# Patient Record
Sex: Female | Born: 1970
Health system: Southern US, Community
[De-identification: ages and names within clinical notes are randomized; demographics above are authoritative.]

## PROBLEM LIST (undated history)

## (undated) DIAGNOSIS — D649 Anemia, unspecified: Secondary | ICD-10-CM

## (undated) DIAGNOSIS — D219 Benign neoplasm of connective and other soft tissue, unspecified: Secondary | ICD-10-CM

---

## 1995-12-22 DIAGNOSIS — D219 Benign neoplasm of connective and other soft tissue, unspecified: Secondary | ICD-10-CM

## 1995-12-22 HISTORY — DX: Benign neoplasm of connective and other soft tissue, unspecified: D21.9

## 2000-12-21 HISTORY — PX: DILATION AND CURETTAGE OF UTERUS: SHX78

## 2006-07-15 ENCOUNTER — Other Ambulatory Visit: Admission: RE | Admit: 2006-07-15 | Discharge: 2006-07-15 | Payer: Self-pay | Admitting: Family Medicine

## 2006-07-16 ENCOUNTER — Ambulatory Visit (HOSPITAL_COMMUNITY): Admission: RE | Admit: 2006-07-16 | Discharge: 2006-07-16 | Payer: Self-pay | Admitting: Family Medicine

## 2007-11-21 ENCOUNTER — Ambulatory Visit (HOSPITAL_COMMUNITY): Admission: RE | Admit: 2007-11-21 | Discharge: 2007-11-21 | Payer: Self-pay | Admitting: Family Medicine

## 2011-09-29 ENCOUNTER — Inpatient Hospital Stay (HOSPITAL_COMMUNITY)
Admission: AD | Admit: 2011-09-29 | Discharge: 2011-09-29 | Disposition: A | Discharge status: Home / Self Care | Payer: Self-pay | Source: Ambulatory Visit | Attending: Family Medicine | Admitting: Family Medicine

## 2011-09-29 ENCOUNTER — Encounter (HOSPITAL_COMMUNITY): Payer: Self-pay | Admitting: *Deleted

## 2011-09-29 ENCOUNTER — Inpatient Hospital Stay (HOSPITAL_COMMUNITY): Payer: Self-pay

## 2011-09-29 DIAGNOSIS — D259 Leiomyoma of uterus, unspecified: Secondary | ICD-10-CM | POA: Insufficient documentation

## 2011-09-29 DIAGNOSIS — D649 Anemia, unspecified: Secondary | ICD-10-CM | POA: Insufficient documentation

## 2011-09-29 DIAGNOSIS — N920 Excessive and frequent menstruation with regular cycle: Secondary | ICD-10-CM | POA: Insufficient documentation

## 2011-09-29 DIAGNOSIS — D219 Benign neoplasm of connective and other soft tissue, unspecified: Secondary | ICD-10-CM

## 2011-09-29 HISTORY — DX: Benign neoplasm of connective and other soft tissue, unspecified: D21.9

## 2011-09-29 HISTORY — DX: Anemia, unspecified: D64.9

## 2011-09-29 LAB — CBC
HCT: 23 % — ABNORMAL LOW (ref 36.0–46.0)
Hemoglobin: 7 g/dL — ABNORMAL LOW (ref 12.0–15.0)
MCH: 24.1 pg — ABNORMAL LOW (ref 26.0–34.0)
MCHC: 30.4 g/dL (ref 30.0–36.0)
MCV: 79 fL (ref 78.0–100.0)
Platelets: 349 10*3/uL (ref 150–400)
RBC: 2.91 MIL/uL — ABNORMAL LOW (ref 3.87–5.11)
RDW: 14.3 % (ref 11.5–15.5)
WBC: 10.7 10*3/uL — ABNORMAL HIGH (ref 4.0–10.5)

## 2011-09-29 LAB — ABO/RH: ABO/RH(D): O POS

## 2011-09-29 LAB — POCT PREGNANCY, URINE: Preg Test, Ur: NEGATIVE

## 2011-09-29 MED ORDER — TRANEXAMIC ACID 650 MG PO TABS: 1300.0000 mg | ORAL_TABLET | Freq: Three times a day (TID) | ORAL | Status: DC

## 2011-09-29 MED ORDER — FERROUS SULFATE 325 (65 FE) MG PO TABS: 325.0000 mg | ORAL_TABLET | Freq: Three times a day (TID) | ORAL | Status: DC

## 2011-09-29 NOTE — ED Provider Notes (Addendum)
History   The patient is a 40 y.o. year old G0P0000 female who presents to MAU reporting worsening of menorrhagia. She was seen at [facility] this morning and instructed to come to MAU for further eval. She has a 15 year Hx of Menorrhagia and was Dx w/ Fibroids 5 years ago. [facility] and was Rx Provera x 10 days. Bleeding stopped and resumed 09/22/11 and has been very heavy since then. She reports dizziness and tingling lips, but denies syncope or near-syncope. She has an appointment it Gyn clinic 10/04/11. She is TTC. Hgb:   July 2012 9.23 July 2011 ~8   09/29/11 7.0    CSN: [id number] Arrival date & time: 09/29/2011  1:27 PM  Chief Complaint  Patient presents with  . Menometrorrhagia    (Consider location/radiation/quality/duration/timing/severity/associated sxs/prior treatment) HPI  Past Medical History  Diagnosis Date  . Anemia   . Fibroid 1997    Past Surgical History  Procedure Date  . Dilation and curettage of uterus 2002    abn bleeding    Family History  Problem Relation Age of Onset  . Diabetes Mother   . Hypertension Father     History  Substance Use Topics  . Smoking status: Current Some Day Smoker  . Smokeless tobacco: Not on file  . Alcohol Use: Yes    OB History    Grav Para Term Preterm Abortions TAB SAB Ect Mult Living   0 0 0 0 0 0 0 0 0 0       Review of Systems: Otherwise neg  Allergies  Review of patient's allergies indicates no known allergies.  Home Medications  No current outpatient prescriptions on file.  BP 121/54  Pulse 121  Temp(Src) 98.6 F (37 C) (Oral)  Resp 20  SpO2 100%  LMP 09/22/2011 Patient Vitals for the past 24 hrs:  BP Temp Temp src Pulse Resp SpO2  09/29/11 1635 121/54 mmHg - - 121  - -  09/29/11 1629 112/66 mmHg - - 91  - -  09/29/11 1627 107/63 mmHg - - 88  - -  09/29/11 1622 129/49 mmHg - - 77  - -  09/29/11 1346 - - - - - 100 %  09/29/11 1344  118/45 mmHg 98.6 F (37 C) Oral 111  20  100 %   Physical Exam  Nursing note and vitals reviewed. Constitutional: She appears well-developed and well-nourished. No distress.  Eyes: Conjunctivae are normal.  Cardiovascular: Normal rate.   Pulmonary/Chest: Effort normal.  Abdominal: Soft. Bowel sounds are normal. There is no tenderness.  Genitourinary: Uterus is enlarged. Uterus is not tender. Cervix exhibits no motion tenderness, no discharge and no friability. There is bleeding (mod BRB) around the vagina.    ED Course  Procedures (including critical care time) POCT PREGNANCY, URINE: neg  ABO/RH: O pos   Results for orders placed during the hospital encounter of 09/29/11 (from the past 24 hour(s))  CBC     Status: Abnormal   Collection Time   09/29/11  5:53 PM      Component Value Range   WBC 10.7 (*) 4.0 - 10.5 (K/uL)   RBC 2.91 (*) 3.87 - 5.11 (MIL/uL)   Hemoglobin 7.0 (*) 12.0 - 15.0 (g/dL)   HCT 65.7 (*) 84.6 - 46.0 (%)   MCV 79.0  78.0 - 100.0 (fL)   MCH 24.1 (*) 26.0 - 34.0 (pg)   MCHC 30.4  30.0 - 36.0 (  g/dL)   RDW 16.1  09.6 - 04.5 (%)   Platelets 349  150 - 400 (K/uL)   US Transvaginal Non-ob  09/29/2011  *RADIOLOGY REPORT*  Clinical Data: Menorrhagia  TRANSABDOMINAL AND TRANSVAGINAL ULTRASOUND OF PELVIS Technique:  Both transabdominal and transvaginal ultrasound examinations of the pelvis were performed. Transabdominal technique was performed for global imaging of the pelvis including uterus, ovaries, adnexal regions, and pelvic cul-de-sac.  Comparison: 11/21/2007   It was necessary to proceed with endovaginal exam following the transabdominal exam to visualize the uterus more accurately.  Findings:  Uterus: The uterus measures 10.1 x 5.2 x 5.8 cm.  There are two small subserosal leiomyomas at the vertex on the order of 1-1.2 cm in size.  Endometrium: 9.3 mm thick.  No endometrial fluid.  Right ovary:  Normal at 3.9 x 3.2 x 1.9 cm.  Normal follicular cysts.  Normal blood  flow.  Left ovary: Normal at 4.3 x 1.8 x 1.5 cm.  Normal follicular cysts. Normal blood flow.  Other findings: Trace amount of free fluid, not likely significant.  IMPRESSION: The examination is normal except for two small subserosal myomas near the vertex on the order of 1-1.2 cm in size.  Original Report Authenticated By: Thomasenia Sales, M.D.   US Pelvis Complete  09/29/2011  *RADIOLOGY REPORT*  Clinical Data: Menorrhagia  TRANSABDOMINAL AND TRANSVAGINAL ULTRASOUND OF PELVIS Technique:  Both transabdominal and transvaginal ultrasound examinations of the pelvis were performed. Transabdominal technique was performed for global imaging of the pelvis including uterus, ovaries, adnexal regions, and pelvic cul-de-sac.  Comparison: 11/21/2007   It was necessary to proceed with endovaginal exam following the transabdominal exam to visualize the uterus more accurately.  Findings:  Uterus: The uterus measures 10.1 x 5.2 x 5.8 cm.  There are two small subserosal leiomyomas at the vertex on the order of 1-1.2 cm in size.  Endometrium: 9.3 mm thick.  No endometrial fluid.  Right ovary:  Normal at 3.9 x 3.2 x 1.9 cm.  Normal follicular cysts.  Normal blood flow.  Left ovary: Normal at 4.3 x 1.8 x 1.5 cm.  Normal follicular cysts. Normal blood flow.  Other findings: Trace amount of free fluid, not likely significant.  IMPRESSION: The examination is normal except for two small subserosal myomas near the vertex on the order of 1-1.2 cm in size.  Original Report Authenticated By: Thomasenia Sales, M.D.     MDM  Assessment: 1. Menorrhagia 2. Anemic, symptomatic, but stable 3. TTC 4. Fibroids  Plan: 1. Offered transfusion and/or progesterone therapy per consult w/ Dr, Macon Large. Discussed R/B/I. Pt declines, prefers to avoid needles, blood products or hormones.  2. D/C home 3. F/U in Gyn clinic 11/04/11 4. Rx Lysteda 1300 PO TID x days 1-5 of period   Dorathy Kinsman 09/29/2011 9:13 PM  Attestation of Attending  Supervision of Advanced Practitioner: Evaluation and management procedures were performed by the PA/NP/CNM/OB Fellow under my supervision/collaboration. Chart reviewed and agree with management and plan.  Shakim Faith A 09/30/2011 12:28 AM

## 2011-09-29 NOTE — Progress Notes (Addendum)
Abnormal bleeding since age 40 and since July heavier, [facility] and  Has [facility] clinic NOV , took cycle of progesterone x 10 days, HGB 7.1 today at [facility]

## 2011-09-30 LAB — TSH: TSH: 1.613 u[IU]/mL (ref 0.350–4.500)

## 2011-10-08 ENCOUNTER — Encounter: Payer: Self-pay | Admitting: Obstetrics & Gynecology

## 2011-10-08 ENCOUNTER — Ambulatory Visit (INDEPENDENT_AMBULATORY_CARE_PROVIDER_SITE_OTHER): Payer: Self-pay | Admitting: Obstetrics & Gynecology

## 2011-10-08 ENCOUNTER — Other Ambulatory Visit (HOSPITAL_COMMUNITY)
Admission: RE | Admit: 2011-10-08 | Discharge: 2011-10-08 | Disposition: A | Discharge status: Home / Self Care | Payer: Self-pay | Source: Ambulatory Visit | Attending: Obstetrics & Gynecology | Admitting: Obstetrics & Gynecology

## 2011-10-08 VITALS — BP 128/89 | HR 96 | Temp 98.2°F | Ht 66.0 in | Wt 267.2 lb

## 2011-10-08 DIAGNOSIS — D259 Leiomyoma of uterus, unspecified: Secondary | ICD-10-CM

## 2011-10-08 DIAGNOSIS — D219 Benign neoplasm of connective and other soft tissue, unspecified: Secondary | ICD-10-CM

## 2011-10-08 DIAGNOSIS — D649 Anemia, unspecified: Secondary | ICD-10-CM

## 2011-10-08 DIAGNOSIS — N920 Excessive and frequent menstruation with regular cycle: Secondary | ICD-10-CM

## 2011-10-08 LAB — CBC WITH DIFFERENTIAL/PLATELET
HCT: 26.6 % — ABNORMAL LOW (ref 36.0–46.0)
Hemoglobin: 7.6 g/dL — ABNORMAL LOW (ref 12.0–15.0)
MCHC: 28.6 g/dL — ABNORMAL LOW (ref 30.0–36.0)
Monocytes Absolute: 0.5 10*3/uL (ref 0.1–1.0)
Neutrophils Relative %: 70 % (ref 43–77)
Platelets: 431 10*3/uL — ABNORMAL HIGH (ref 150–400)
RBC: 3.37 MIL/uL — ABNORMAL LOW (ref 3.87–5.11)

## 2011-10-08 MED ORDER — MEDROXYPROGESTERONE ACETATE 10 MG PO TABS: ORAL_TABLET | ORAL | Status: DC

## 2011-10-08 NOTE — Progress Notes (Signed)
  Subjective:    Patient ID: Kimberly Coleman, female    DOB: 1971/05/02, 40 y.o.   MRN: [id number]  HPI  Kimberly Coleman is a 40 yo single AA G0 who is referred here by [facility] for evaluation of menorrhagia.  A recent ultrasound shows 2 small subserosal fibroids.  She reports heavy periods for more than 15 years, but over the last 8 months her bleeding has increased to being almost every day.  She uses an entire bag of "super plus" overnight pads per day. Her HBG is 7 and she reports fatigue and some shortness of breath with walking. She tried 10 days of provera and it only gave her 2 blood-free days.  Review of Systems    pap reported to be normal last month at [facility]. She would like to have a baby but is currently not sexually active because of all the bloodEndometrial Biopsy Procedure Note  Pre-operative Diagnosis: menorrhagia  Post-operative Diagnosis: same  Indications: abnormal uterine bleeding  Procedure Details   Urine pregnancy test was done  and result was negative.  The risks (including infection, bleeding, pain, and uterine perforation) and benefits of the procedure were explained to the patient and Verbal informed consent was obtained.  Antibiotic prophylaxis against endocarditis was not indicated.   The patient was placed in the dorsal lithotomy position.  Bimanual exam showed the uterus to be in the anteroflexed position.  A Graves' speculum inserted in the vagina, and the cervix prepped with povidone iodine.  Endocervical curettage with a Kevorkian curette was not performed.   A sharp tenaculum was applied to the anterior lip of the cervix for stabilization.  A sterile uterine sound was used to sound the uterus to a depth of 8cm.  A Pipelle endometrial aspirator was used to sample the endometrium.  Sample was sent for pathologic examination.  Condition: Stable  Complications: None  Plan:  The patient was advised to call for any fever or for prolonged or severe pain or  bleeding. She was advised to use NSAID as needed for mild to moderate pain. She was advised to avoid vaginal intercourse for 48 hours or until the bleeding has completely stopped.  Attending Physician Documentation: I was present for or participated in the entire procedure, including opening and closing.  Objective:   Physical Exam       Assessment & Plan:  DUB and menorrhagia- possibly related to the fibroids, but not a given considering their subserosal position.  Await EMBX results. We discussed transfusion. She declines transfusion now, but wants to see if it has increased. Her primary goal now is to get pregnant, so she declines Novasure, Mirena, OCPs, Depoprovera. She is willing to try cyclic provera.

## 2011-10-09 ENCOUNTER — Encounter: Payer: Self-pay | Admitting: *Deleted

## 2011-10-09 DIAGNOSIS — N938 Other specified abnormal uterine and vaginal bleeding: Secondary | ICD-10-CM | POA: Insufficient documentation

## 2011-10-09 DIAGNOSIS — N920 Excessive and frequent menstruation with regular cycle: Secondary | ICD-10-CM | POA: Insufficient documentation

## 2011-10-09 DIAGNOSIS — D252 Subserosal leiomyoma of uterus: Secondary | ICD-10-CM | POA: Insufficient documentation

## 2011-10-16 MED ORDER — MEDROXYPROGESTERONE ACETATE 10 MG PO TABS: ORAL_TABLET | ORAL | Status: DC

## 2011-10-16 NOTE — Progress Notes (Signed)
Addended by: Dorathy Kinsman on: 10/16/2011 05:29 PM   Modules accepted: Orders

## 2011-11-04 ENCOUNTER — Encounter: Payer: Self-pay | Admitting: Obstetrics & Gynecology

## 2013-12-28 ENCOUNTER — Other Ambulatory Visit (HOSPITAL_COMMUNITY)
Admission: RE | Admit: 2013-12-28 | Discharge: 2013-12-28 | Disposition: A | Discharge status: Home / Self Care | Payer: BC Managed Care – PPO | Source: Ambulatory Visit | Attending: Obstetrics & Gynecology | Admitting: Obstetrics & Gynecology

## 2013-12-28 ENCOUNTER — Other Ambulatory Visit: Payer: Self-pay | Admitting: Obstetrics & Gynecology

## 2013-12-28 DIAGNOSIS — Z1151 Encounter for screening for human papillomavirus (HPV): Secondary | ICD-10-CM | POA: Insufficient documentation

## 2013-12-28 DIAGNOSIS — Z01419 Encounter for gynecological examination (general) (routine) without abnormal findings: Secondary | ICD-10-CM | POA: Insufficient documentation

## 2014-08-06 ENCOUNTER — Inpatient Hospital Stay (HOSPITAL_COMMUNITY): Payer: BC Managed Care – PPO

## 2014-08-06 ENCOUNTER — Inpatient Hospital Stay (HOSPITAL_COMMUNITY)
Admission: AD | Admit: 2014-08-06 | Discharge: 2014-08-06 | Disposition: A | Discharge status: Home / Self Care | Payer: BC Managed Care – PPO | Source: Ambulatory Visit | Attending: Obstetrics and Gynecology | Admitting: Obstetrics and Gynecology

## 2014-08-06 ENCOUNTER — Inpatient Hospital Stay: Payer: BC Managed Care – PPO

## 2014-08-06 ENCOUNTER — Encounter (HOSPITAL_COMMUNITY): Payer: Self-pay

## 2014-08-06 DIAGNOSIS — R109 Unspecified abdominal pain: Secondary | ICD-10-CM | POA: Diagnosis present

## 2014-08-06 DIAGNOSIS — D259 Leiomyoma of uterus, unspecified: Secondary | ICD-10-CM | POA: Insufficient documentation

## 2014-08-06 DIAGNOSIS — F17200 Nicotine dependence, unspecified, uncomplicated: Secondary | ICD-10-CM | POA: Diagnosis not present

## 2014-08-06 DIAGNOSIS — R300 Dysuria: Secondary | ICD-10-CM | POA: Insufficient documentation

## 2014-08-06 DIAGNOSIS — N949 Unspecified condition associated with female genital organs and menstrual cycle: Secondary | ICD-10-CM | POA: Diagnosis not present

## 2014-08-06 DIAGNOSIS — N938 Other specified abnormal uterine and vaginal bleeding: Secondary | ICD-10-CM | POA: Insufficient documentation

## 2014-08-06 LAB — URINALYSIS, ROUTINE W REFLEX MICROSCOPIC
Bilirubin Urine: NEGATIVE
GLUCOSE, UA: NEGATIVE mg/dL
KETONES UR: 15 mg/dL — AB
LEUKOCYTES UA: NEGATIVE
Nitrite: NEGATIVE
PROTEIN: NEGATIVE mg/dL
SPECIFIC GRAVITY, URINE: 1.015 (ref 1.005–1.030)
UROBILINOGEN UA: 0.2 mg/dL (ref 0.0–1.0)
pH: 7.5 (ref 5.0–8.0)

## 2014-08-06 LAB — HCG, QUANTITATIVE, PREGNANCY: hCG, Beta Chain, Quant, S: <1 m[IU]/mL (ref <5)

## 2014-08-06 LAB — CBC
HCT: 32.7 % — ABNORMAL LOW (ref 36.0–46.0)
Hemoglobin: 10.1 g/dL — ABNORMAL LOW (ref 12.0–15.0)
MCH: 20.8 pg — ABNORMAL LOW (ref 26.0–34.0)
MCHC: 30.9 g/dL (ref 30.0–36.0)
MCV: 67.4 fL — AB (ref 78.0–100.0)
Platelets: 353 10*3/uL (ref 150–400)
RBC: 4.85 MIL/uL (ref 3.87–5.11)
RDW: 16.9 % — AB (ref 11.5–15.5)
WBC: 20.9 10*3/uL — ABNORMAL HIGH (ref 4.0–10.5)

## 2014-08-06 LAB — WET PREP, GENITAL
CLUE CELLS WET PREP: NONE SEEN
TRICH WET PREP: NONE SEEN
Yeast Wet Prep HPF POC: NONE SEEN

## 2014-08-06 LAB — URINE MICROSCOPIC-ADD ON

## 2014-08-06 MED ORDER — ONDANSETRON 4 MG PO TBDP
4.0000 mg | ORAL_TABLET | Freq: Once | ORAL | Status: DC
  Filled 2014-08-06: qty 1

## 2014-08-06 MED ORDER — ACETAMINOPHEN-CODEINE #3 300-30 MG PO TABS: 1.0000 | ORAL_TABLET | Freq: Four times a day (QID) | ORAL | Status: DC | PRN

## 2014-08-06 MED ORDER — IOHEXOL 300 MG/ML  SOLN
100.0000 mL | Freq: Once | INTRAMUSCULAR | Status: AC | PRN
  Administered 2014-08-06: 100 mL via INTRAVENOUS

## 2014-08-06 MED ORDER — KETOROLAC TROMETHAMINE 60 MG/2ML IM SOLN
60.0000 mg | INTRAMUSCULAR | Status: AC
  Administered 2014-08-06: 60 mg via INTRAMUSCULAR
  Filled 2014-08-06: qty 2

## 2014-08-06 MED ORDER — IOHEXOL 300 MG/ML  SOLN
50.0000 mL | INTRAMUSCULAR | Status: AC
  Administered 2014-08-06: 50 mL via ORAL

## 2014-08-06 NOTE — Discharge Instructions (Signed)
Fibroids °Fibroids are lumps (tumors) that can occur any place in a woman's body. These lumps are not cancerous. Fibroids vary in size, weight, and where they grow. °HOME CARE °· Do not take aspirin. °· Write down the number of pads or tampons you use during your period. Tell your doctor. This can help determine the best treatment for you. °GET HELP RIGHT AWAY IF: °· You have pain in your lower belly (abdomen) that is not helped with medicine. °· You have cramps that are not helped with medicine. °· You have more bleeding between or during your period. °· You feel lightheaded or pass out (faint). °· Your lower belly pain gets worse. °MAKE SURE YOU: °· Understand these instructions. °· Will watch your condition. °· Will get help right away if you are not doing well or get worse. °Document Released: 01/09/2011 Document Revised: 02/29/2012 Document Reviewed: 01/09/2011 °ExitCare® Patient Information ©2015 [employer]. This information is not intended to replace advice given to you by your health care provider. Make sure you discuss any questions you have with your health care provider. °Abnormal Uterine Bleeding °Abnormal uterine bleeding means bleeding from the vagina that is not your normal menstrual period. This can be: °· Bleeding or spotting between periods. °· Bleeding after sex (sexual intercourse). °· Bleeding that is heavier or more than normal. °· Periods that last longer than usual. °· Bleeding after menopause. °There are many problems that may cause this. Treatment will depend on the cause of the bleeding. Any kind of bleeding that is not normal should be reviewed by your doctor.  °HOME CARE °Watch your condition for any changes. These actions may lessen any discomfort you are having: °· Do not use tampons or douches as told by your doctor. °· Change your pads often. °You should get regular pelvic exams and Pap tests. Keep all appointments for tests as told by your doctor. °GET HELP IF: °· You are  bleeding for more than 1 week. °· You feel dizzy at times. °GET HELP RIGHT AWAY IF:  °· You pass out. °· You have to change pads every 15 to 30 minutes. °· You have belly pain. °· You have a fever. °· You become sweaty or weak. °· You are passing large blood clots from the vagina. °· You feel sick to your stomach (nauseous) and throw up (vomit). °MAKE SURE YOU: °· Understand these instructions. °· Will watch your condition. °· Will get help right away if you are not doing well or get worse. °Document Released: 10/04/2009 Document Revised: 12/12/2013 Document Reviewed: 07/06/2013 °ExitCare® Patient Information ©2015 [employer]. This information is not intended to replace advice given to you by your health care provider. Make sure you discuss any questions you have with your health care provider. ° °

## 2014-08-06 NOTE — MAU Note (Signed)
Pt reports she started her period yesterday and has had a lot of cramping and pain and nausea yesterday.vomited x one. Pain with urination.

## 2014-08-06 NOTE — MAU Note (Signed)
Pt states hx of long menstrual cycles. Had taken ibuprofen at home. Wearing large pad, dry at present.

## 2014-08-06 NOTE — MAU Provider Note (Signed)
History     CSN: [id number]  Arrival date and time: 08/06/14 9371   First Provider Initiated Contact with Patient 08/06/14 [phone]      Chief Complaint  Patient presents with  . Abdominal Pain  . Dysuria   Abdominal Pain Associated symptoms include dysuria.  Dysuria    Kimberly Coleman is a 43 y.o. G0P0000 who presents today with 10/10 cramping pain. She states that she has been bleeding off and on for "months". She took her nuva ring out on Wednesday, and the bleeding was lighter. Last night she states that the bleeding mostly stopped. Then she started to have severe cramping pain.   Past Medical History  Diagnosis Date  . Anemia   . Fibroid 1997    Past Surgical History  Procedure Laterality Date  . Dilation and curettage of uterus  2002    abn bleeding    Family History  Problem Relation Age of Onset  . Diabetes Mother   . Hypertension Father     History  Substance Use Topics  . Smoking status: Current Some Day Smoker  . Smokeless tobacco: Not on file  . Alcohol Use: Yes    Allergies:  Allergies  Allergen Reactions  . Percocet [Oxycodone-Acetaminophen] Nausea And Vomiting    Prescriptions prior to admission  Medication Sig Dispense Refill  . b complex vitamins capsule Take 1 capsule by mouth daily.        Marland Kitchen etonogestrel-ethinyl estradiol (NUVARING) 0.12-0.015 MG/24HR vaginal ring Place 1 each vaginally every 28 (twenty-eight) days. Insert vaginally and leave in place for 3 consecutive weeks, then remove for 1 week.      . ferrous sulfate 325 (65 FE) MG tablet Take 1 tablet (325 mg total) by mouth 3 (three) times daily with meals.      Marland Kitchen ibuprofen (ADVIL,MOTRIN) 200 MG tablet Take 200 mg by mouth every 6 (six) hours as needed.        . Vitamin Mixture (VITAMIN E COMPLETE PO) Take by mouth daily.        . medroxyPROGESTERone (PROVERA) 10 MG tablet 1 tablet daily 10 days per month cyclicly.  10 tablet  4  . tranexamic acid (LYSTEDA) 650 MG TABS Take 2 tablets  (1,300 mg total) by mouth 3 (three) times daily.  90 tablet  12   Results for orders placed during the hospital encounter of 08/06/14 (from the past 48 hour(s))  URINALYSIS, ROUTINE W REFLEX MICROSCOPIC     Status: Abnormal   Collection Time    08/06/14  6:43 AM      Result Value Ref Range   Color, Urine YELLOW  YELLOW   APPearance CLEAR  CLEAR   Specific Gravity, Urine 1.015  1.005 - 1.030   pH 7.5  5.0 - 8.0   Glucose, UA NEGATIVE  NEGATIVE mg/dL   Hgb urine dipstick LARGE (*) NEGATIVE   Bilirubin Urine NEGATIVE  NEGATIVE   Ketones, ur 15 (*) NEGATIVE mg/dL   Protein, ur NEGATIVE  NEGATIVE mg/dL   Urobilinogen, UA 0.2  0.0 - 1.0 mg/dL   Nitrite NEGATIVE  NEGATIVE   Leukocytes, UA NEGATIVE  NEGATIVE  URINE MICROSCOPIC-ADD ON     Status: None   Collection Time    08/06/14  6:43 AM      Result Value Ref Range   Squamous Epithelial / LPF RARE  RARE   WBC, UA 0-2  <3 WBC/hpf   RBC / HPF 3-6  <3 RBC/hpf  WET PREP, GENITAL  Status: Abnormal   Collection Time    08/06/14  7:42 AM      Result Value Ref Range   Yeast Wet Prep HPF POC NONE SEEN  NONE SEEN   Trich, Wet Prep NONE SEEN  NONE SEEN   Clue Cells Wet Prep HPF POC NONE SEEN  NONE SEEN   WBC, Wet Prep HPF POC MODERATE (*) NONE SEEN   Comment: FEW BACTERIA SEEN  CBC     Status: Abnormal   Collection Time    08/06/14  7:55 AM      Result Value Ref Range   WBC 20.9 (*) 4.0 - 10.5 K/uL   RBC 4.85  3.87 - 5.11 MIL/uL   Hemoglobin 10.1 (*) 12.0 - 15.0 g/dL   HCT 32.7 (*) 36.0 - 46.0 %   MCV 67.4 (*) 78.0 - 100.0 fL   MCH 20.8 (*) 26.0 - 34.0 pg   MCHC 30.9  30.0 - 36.0 g/dL   RDW 16.9 (*) 11.5 - 15.5 %   Platelets 353  150 - 400 K/uL  HCG, QUANTITATIVE, PREGNANCY     Status: None   Collection Time    08/06/14  8:15 AM      Result Value Ref Range   hCG, Beta Chain, Quant, S <1  <5 mIU/mL   Comment:              GEST. AGE      CONC.  (mIU/mL)       <=1 WEEK        5 - 50         2 WEEKS       50 - 500         3  WEEKS       100 - 10,000         4 WEEKS     1,000 - 30,000         5 WEEKS     3,500 - 115,000       6-8 WEEKS     12,000 - 270,000        12 WEEKS     15,000 - 220,000                FEMALE AND NON-PREGNANT FEMALE:         LESS THAN 5 mIU/mL     REPEATED TO VERIFY   US Transvaginal Non-ob  08/06/2014   CLINICAL DATA:  Pelvic pain, vaginal bleeding.  EXAM: TRANSABDOMINAL AND TRANSVAGINAL ULTRASOUND OF PELVIS  TECHNIQUE: Both transabdominal and transvaginal ultrasound examinations of the pelvis were performed. Transabdominal technique was performed for global imaging of the pelvis including uterus, ovaries, adnexal regions, and pelvic cul-de-sac. It was necessary to proceed with endovaginal exam following the transabdominal exam to visualize the endometrium and ovaries.  COMPARISON:  September 29, 2011.  FINDINGS: Uterus  Measurements: 9.8 x 6.6 x 5.9 cm. No fibroids or other mass visualized.  Endometrium  Thickness: 18 mm.  Contours may be slightly irregular.  Right ovary  Measurements: 2.8 x 2.5 x 2.0 cm. Normal appearance/no adnexal mass.  Left ovary  Measurements: 3.4 x 3.4 x 2.3 cm. Normal appearance/no adnexal mass.  Other findings  No free fluid.  IMPRESSION: Endometrial thickness is considered abnormal. Consider follow-up by [place] in 6-8 weeks, during the week immediately following menses (exam timing is critical).   Electronically Signed   By: Sabino Dick M.D.   On: 08/06/2014 08:54   US Pelvis Complete  08/06/2014   CLINICAL DATA:  Pelvic pain, vaginal bleeding.  EXAM: TRANSABDOMINAL AND TRANSVAGINAL ULTRASOUND OF PELVIS  TECHNIQUE: Both transabdominal and transvaginal ultrasound examinations of the pelvis were performed. Transabdominal technique was performed for global imaging of the pelvis including uterus, ovaries, adnexal regions, and pelvic cul-de-sac. It was necessary to proceed with endovaginal exam following the transabdominal exam to visualize the endometrium and ovaries.  COMPARISON:   September 29, 2011.  FINDINGS: Uterus  Measurements: 9.8 x 6.6 x 5.9 cm. No fibroids or other mass visualized.  Endometrium  Thickness: 18 mm.  Contours may be slightly irregular.  Right ovary  Measurements: 2.8 x 2.5 x 2.0 cm. Normal appearance/no adnexal mass.  Left ovary  Measurements: 3.4 x 3.4 x 2.3 cm. Normal appearance/no adnexal mass.  Other findings  No free fluid.  IMPRESSION: Endometrial thickness is considered abnormal. Consider follow-up by [place] in 6-8 weeks, during the week immediately following menses (exam timing is critical).   Electronically Signed   By: Sabino Dick M.D.   On: 08/06/2014 08:54   Ct Abdomen Pelvis W Contrast  08/06/2014   CLINICAL DATA:  Cramping, LEFT lower quadrant abdominal and pelvic pain, nausea, diaphoresis, vomiting, pain with urination, began menses yesterday, leukocytosis, question diverticulitis, history smoking  EXAM: CT ABDOMEN AND PELVIS WITH CONTRAST  TECHNIQUE: Multidetector CT imaging of the abdomen and pelvis was performed using the standard protocol following bolus administration of intravenous contrast. Sagittal and coronal MPR images reconstructed from axial data set.  CONTRAST:  171mL OMNIPAQUE IOHEXOL 300 MG/ML SOLN IV. Dilute oral contrast.  COMPARISON:  None; correlation made with pelvic ultrasound of 08/06/2014  FINDINGS: Lung bases clear.  Liver, spleen, pancreas, kidneys, and adrenal glands normal.  Normal appendix.  Uterus appears mildly enlarged with an enhancing nodular focus in the LEFT lateral uterus 2.8 x 2.0 cm image 71, not seen on preceding ultrasound.  Low-attenuation focus more inferiorly in uterus 2.1 x 1.7 cm, question low-attenuation leiomyoma versus prominent endometrial canal identified on ultrasound.  Small amount of nonspecific cul de sac free fluid.  Unremarkable adnexae, bladder and ureters.  Stomach and bowel loops normal appearance.  Specifically no sigmoid diverticula or inflammatory changes to suggest diverticulitis or colitis.  No  mass, adenopathy, free fluid or free air.  Bones unremarkable.  IMPRESSION: Small probable uterine leiomyoma with additional focus of low attenuation which could represent an additional leiomyoma or be related to mildly prominent endometrial complex identified on preceding ultrasound.  Small amount of nonspecific free pelvic fluid.  Otherwise negative exam.   Electronically Signed   By: Lavonia Dana M.D.   On: 08/06/2014 12:47    Review of Systems  Gastrointestinal: Positive for abdominal pain.  Genitourinary: Positive for dysuria.   Physical Exam   Height 5\' 6"  (1.676 m), weight 121.11 kg (267 lb), last menstrual period 08/05/2014.  Physical Exam  Nursing note and vitals reviewed. Constitutional: She is oriented to person, place, and time. She appears well-developed and well-nourished. No distress.  Cardiovascular: Normal rate.   Respiratory: Effort normal.  GI: Soft. There is no tenderness. There is no rebound.  Genitourinary:   External: no lesion Vagina: golf ball sized malodorous clot removed from the vagina. After the clot was removed there was a large amount of thin, watery pink blood seen.  Cervix: pink, smooth, no CMT Uterus: slightly enlarged  Adnexa: NT   Neurological: She is alert and oriented to person, place, and time.  Skin: Skin is warm and dry.  Psychiatric:  She has a normal mood and affect.    MAU Course  Procedures  Results for orders placed during the hospital encounter of 08/06/14 (from the past 24 hour(s))  URINALYSIS, ROUTINE W REFLEX MICROSCOPIC     Status: Abnormal   Collection Time    08/06/14  6:43 AM      Result Value Ref Range   Color, Urine YELLOW  YELLOW   APPearance CLEAR  CLEAR   Specific Gravity, Urine 1.015  1.005 - 1.030   pH 7.5  5.0 - 8.0   Glucose, UA NEGATIVE  NEGATIVE mg/dL   Hgb urine dipstick LARGE (*) NEGATIVE   Bilirubin Urine NEGATIVE  NEGATIVE   Ketones, ur 15 (*) NEGATIVE mg/dL   Protein, ur NEGATIVE  NEGATIVE mg/dL    Urobilinogen, UA 0.2  0.0 - 1.0 mg/dL   Nitrite NEGATIVE  NEGATIVE   Leukocytes, UA NEGATIVE  NEGATIVE  URINE MICROSCOPIC-ADD ON     Status: None   Collection Time    08/06/14  6:43 AM      Result Value Ref Range   Squamous Epithelial / LPF RARE  RARE   WBC, UA 0-2  <3 WBC/hpf   RBC / HPF 3-6  <3 RBC/hpf   CBC, [place] pending  0800: Care turned over to J. Rasch, NP   Mathis Bud 08/06/2014, 7:44 AM   GI: Left lower quadrant tenderness on palpation, abdomen is soft. Negative guarding, negative rebound.   0920: Discussed US findings with Dr. Simona Huh who plans to discuss the patient with Dr. Landry Mellow.  1310: discussed CT scan results with Dr. Landry Mellow.  Pt rates her pain 3/10 and says her pain is much better. I will plan to send patient home with pain medication to take as needed.  There is no source of infection; no explanation for leukocytosis. If fever develops patient is to return to MAU or call PCP.   Assessment and Plan   A:  1. Leiomyoma of uterus, unspecified   2. DUB (dysfunctional uterine bleeding)     P:  Discharge home in stable condition RX: tylenol #3 GC pending Call Dr. Annie Main office tomorrow to schedule follow up Return to MAU with any fever or worsening of symptoms Bleeding precautions

## 2014-08-06 NOTE — MAU Note (Signed)
Saline lock started, awaiting CT

## 2014-08-07 LAB — GC/CHLAMYDIA PROBE AMP
CT Probe RNA: NEGATIVE
GC Probe RNA: NEGATIVE

## 2014-08-09 LAB — POCT PREGNANCY, URINE: Preg Test, Ur: NEGATIVE

## 2015-10-18 ENCOUNTER — Encounter: Payer: 59 | Attending: Obstetrics and Gynecology | Admitting: Skilled Nursing Facility1

## 2015-10-18 ENCOUNTER — Encounter: Payer: Self-pay | Admitting: Skilled Nursing Facility1

## 2015-10-18 VITALS — Ht 66.0 in | Wt 278.0 lb

## 2015-10-18 DIAGNOSIS — Z6841 Body Mass Index (BMI) 40.0 and over, adult: Secondary | ICD-10-CM | POA: Diagnosis not present

## 2015-10-18 DIAGNOSIS — Z713 Dietary counseling and surveillance: Secondary | ICD-10-CM | POA: Insufficient documentation

## 2015-10-18 DIAGNOSIS — E669 Obesity, unspecified: Secondary | ICD-10-CM | POA: Diagnosis present

## 2015-10-18 NOTE — Patient Instructions (Signed)
-  Focus on trying to get 6-8 hours of restful continuous sleep -Honor your body -Listen to your hunger and fullness cues; get to know Britny again -Protein source:   Nuts                         Beans                         Seeds                         Quinoa                         Soy Products -Avoid using juice in your smoothies -Try to eat three meals a day and 2-3 snacks -Try to be physically active every day; Aim for the WII or elliptical for 4 days a week at least 30 minutes

## 2015-10-18 NOTE — Progress Notes (Signed)
  Medical Nutrition Therapy:  Appt start time: 0800 end time:  0900.   Assessment:  Primary concerns today: [employer] employee. Pt states she signed up for the live life well program. Wt hx: . Pt states she was dx with PCOS due to weight gain when she was about 44 years old. Pt states she was put on hormones for PCOS and then gained even more weight. Pt states at that time she was about 250 pounds. Pt states in the last few months she has been 270-280 pounds. Pt states she has an irradick job schedule. Pt states her sleep patterns are imbalanced. Pt states she has a community garden in the spring and summer. Pt states she knows what she needs to do to lose weight but could not verbalize this knowledge.  Preferred Learning Style:   No preference indicated   Learning Readiness:   Contemplating  MEDICATIONS: See List   DIETARY INTAKE:  Usual eating pattern includes 2 meals and 1 snacks per day.  Everyday foods include none stated.  Avoided foods include none stated.    24-hr recall:  B ( AM): cookie Snk ( AM): watermelon  L ( PM): peppers, rice, onion, beans, corn, cheese, tomatoes, chicken Snk ( PM): none D ( PM): none Snk ( PM): none Beverages: water, flavored water, beer  Usual physical activity: ADL's  Estimated energy needs: 1800 calories 200 g carbohydrates 135 g protein 50 g fat  Progress Towards Goal(s):  In progress.   Nutritional Diagnosis:  NB-1.1 Food and nutrition-related knowledge deficit As related to no prior education from a nutrition professional.  As evidenced by BMI 44.87, Pt report, and 24 hr recall..    Intervention:  Nutrition counseling for obesity. Dietitian educated the pt on balanced meals, eating throughout the day, and the importance of proper sleep/physical activity. Goals: -Focus on trying to get 6-8 hours of restful continuous sleep -Honor your body -Listen to your hunger and fullness cues; get to know [place] again -Protein  source:   Nuts                         Beans                         Seeds                         Quinoa                         Soy Products -Avoid using juice in your smoothies -Try to eat three meals a day and 2-3 snacks -Try to be physically active every day; Aim for the WII or elliptical for 4 days a week at least 30 minutes   Teaching Method Utilized:  Visual Auditory  Handouts given during visit include:  Snack Sheet  MyPlate Barriers to learning/adherence to lifestyle change: Hectic work schedule  Demonstrated degree of understanding via:  Teach Back   Monitoring/Evaluation:  Dietary intake, exercise, and body weight prn.

## 2015-12-27 ENCOUNTER — Other Ambulatory Visit: Payer: Self-pay

## 2015-12-27 DIAGNOSIS — Z1231 Encounter for screening mammogram for malignant neoplasm of breast: Secondary | ICD-10-CM

## 2016-01-08 DIAGNOSIS — E669 Obesity, unspecified: Secondary | ICD-10-CM | POA: Diagnosis not present

## 2016-01-08 DIAGNOSIS — Z6841 Body Mass Index (BMI) 40.0 and over, adult: Secondary | ICD-10-CM | POA: Diagnosis not present

## 2016-01-08 DIAGNOSIS — M2550 Pain in unspecified joint: Secondary | ICD-10-CM | POA: Diagnosis not present

## 2016-01-08 DIAGNOSIS — R221 Localized swelling, mass and lump, neck: Secondary | ICD-10-CM | POA: Diagnosis not present

## 2016-01-09 ENCOUNTER — Ambulatory Visit
Admission: RE | Admit: 2016-01-09 | Discharge: 2016-01-09 | Disposition: A | Discharge status: Home / Self Care | Payer: 59 | Source: Ambulatory Visit

## 2016-01-09 ENCOUNTER — Other Ambulatory Visit: Payer: Self-pay | Admitting: Obstetrics & Gynecology

## 2016-01-09 DIAGNOSIS — Z1231 Encounter for screening mammogram for malignant neoplasm of breast: Secondary | ICD-10-CM

## 2016-01-09 DIAGNOSIS — R928 Other abnormal and inconclusive findings on diagnostic imaging of breast: Secondary | ICD-10-CM

## 2016-01-17 ENCOUNTER — Ambulatory Visit
Admission: RE | Admit: 2016-01-17 | Discharge: 2016-01-17 | Disposition: A | Discharge status: Home / Self Care | Payer: 59 | Source: Ambulatory Visit | Attending: Obstetrics & Gynecology | Admitting: Obstetrics & Gynecology

## 2016-01-17 DIAGNOSIS — R928 Other abnormal and inconclusive findings on diagnostic imaging of breast: Secondary | ICD-10-CM

## 2016-01-24 DIAGNOSIS — Z6841 Body Mass Index (BMI) 40.0 and over, adult: Secondary | ICD-10-CM | POA: Diagnosis not present

## 2016-01-24 DIAGNOSIS — Z3009 Encounter for other general counseling and advice on contraception: Secondary | ICD-10-CM | POA: Diagnosis not present

## 2016-01-24 DIAGNOSIS — N926 Irregular menstruation, unspecified: Secondary | ICD-10-CM | POA: Diagnosis not present

## 2016-02-18 DIAGNOSIS — E669 Obesity, unspecified: Secondary | ICD-10-CM | POA: Diagnosis not present

## 2016-02-18 DIAGNOSIS — Z713 Dietary counseling and surveillance: Secondary | ICD-10-CM | POA: Diagnosis not present

## 2016-02-18 DIAGNOSIS — Z6841 Body Mass Index (BMI) 40.0 and over, adult: Secondary | ICD-10-CM | POA: Diagnosis not present

## 2016-02-18 DIAGNOSIS — Z0000 Encounter for general adult medical examination without abnormal findings: Secondary | ICD-10-CM | POA: Diagnosis not present

## 2016-02-19 DIAGNOSIS — Z0000 Encounter for general adult medical examination without abnormal findings: Secondary | ICD-10-CM | POA: Diagnosis not present

## 2016-02-19 DIAGNOSIS — Z6841 Body Mass Index (BMI) 40.0 and over, adult: Secondary | ICD-10-CM | POA: Diagnosis not present

## 2016-02-19 DIAGNOSIS — E669 Obesity, unspecified: Secondary | ICD-10-CM | POA: Diagnosis not present

## 2016-02-19 DIAGNOSIS — Z13220 Encounter for screening for lipoid disorders: Secondary | ICD-10-CM | POA: Diagnosis not present

## 2016-02-21 MED FILL — BELVIQ 10 MG TABLET: 10 | 30 days supply | Qty: 60 | Fill #0

## 2016-03-24 MED FILL — BELVIQ 10 MG TABLET: 10 | 30 days supply | Qty: 60 | Fill #1

## 2016-04-27 MED FILL — BELVIQ 10 MG TABLET: 10 | 30 days supply | Qty: 60 | Fill #2

## 2016-09-15 DIAGNOSIS — Z6841 Body Mass Index (BMI) 40.0 and over, adult: Secondary | ICD-10-CM | POA: Diagnosis not present

## 2016-09-15 DIAGNOSIS — R030 Elevated blood-pressure reading, without diagnosis of hypertension: Secondary | ICD-10-CM | POA: Diagnosis not present

## 2016-09-15 DIAGNOSIS — Z01411 Encounter for gynecological examination (general) (routine) with abnormal findings: Secondary | ICD-10-CM | POA: Diagnosis not present

## 2016-09-15 DIAGNOSIS — Z3044 Encounter for surveillance of vaginal ring hormonal contraceptive device: Secondary | ICD-10-CM | POA: Diagnosis not present

## 2016-09-15 DIAGNOSIS — L298 Other pruritus: Secondary | ICD-10-CM | POA: Diagnosis not present

## 2016-10-20 ENCOUNTER — Encounter: Payer: 59 | Attending: Family Medicine | Admitting: Dietician

## 2016-10-20 DIAGNOSIS — Z713 Dietary counseling and surveillance: Secondary | ICD-10-CM | POA: Insufficient documentation

## 2016-10-20 NOTE — Progress Notes (Signed)
Medical Nutrition Therapy:  Appt start time: 1010 end time:  1120   Assessment:  Primary concerns today: [employer] employee. Has gained about 7 lbs in the past year. She is trying to eat better. Feels like she is not a bad eater but states her mother says she does not eat well. Will skip meals when busy. Trying to pack things more (lunchables, yogurt). Trying to cook on Sundays to have food for the week. Has a garden in the spring and summer.   Has 45 year old nephew living with her for the next 3 months. No one else is at home.   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   Has a goal of sleeping 6 hours per day but doesn't always meet that. [facility] couple of [facility]. Blood pressure has been higher in past few weeks which happens when she has stress. Does not feel like she has extra stress.   Contemplating being a vegetarian again (doesn't like meat). Eats meat because it is easier. Will make more sense to try again when he leaves in a few months. Would like to lose weight and be healthier. Would like to get get weight down to 250 lbs.   Has been logging food on fit bit sporadically in the last few week. Eats out about 1-2 x week.   Having trouble recalling what she eats since there is a lot of variety. Likes to [employer]. Tries to cook 3 meals on Sunday to have food for week.   Has braces so teeth are sore.   Preferred Learning Style:   No preference indicated   Learning Readiness:   Contemplating  MEDICATIONS: See List   DIETARY INTAKE:  Usual eating pattern includes 2 meals and 1 snacks per day.  Everyday foods include none stated.  Avoided foods include: processed meats, allergic to tree nuts, rice   24-hr recall:  B ( AM): none or 2 packs flavored oatmeal with chia seeds and flax seeds Snk ( AM): none  usually L ( PM): none or spaghetti, avocado salad, peanut butter sandwich, lunchables  Snk ( PM): none or chex mix D ( PM):fish tacos no shell from [employer] or taco salad chicken terriaki, vegetable soups  Snk ( PM): none or gelato, lunchable, yogurt Beverages: 32-64 oz water, fruit tea, flavored beer (not often), orange juice sometimes   Usual physical activity: 4 x [facility], Wii at home, Zumba sometimes, planning to work out with a Clinical research associate, signed up a swimming class   Estimated energy needs: 1800 calories 200 g carbohydrates 135 g protein 50 g fat  Progress Towards Goal(s):  In progress.   Nutritional Diagnosis:  NB-1.1 Food and nutrition-related knowledge deficit As related to hx of unstructured meal schedule.  As evidenced by BMI 44.87, Pt report, and 24 hr recall.    Intervention:  Nutrition counseling Try setting reminder in phone to eat every 3-5 hours per week. Try having protein shakes with fruit with you for a quick meal. Continue to have other snacks with protein and carbs. [facility] to [facility]. Check out [facility].  Teaching Method Utilized:  Visual Auditory  Handouts given during visit include:  Snack  Sheet  MyPlate Barriers to learning/adherence to lifestyle change: Hectic work schedule  Demonstrated degree of understanding via:  Teach Back   Monitoring/Evaluation:  Dietary intake, exercise, and body weight prn.

## 2016-10-20 NOTE — Patient Instructions (Addendum)
Try setting reminder in phone to eat every 3-5 hours per week. Try having protein shakes with fruit with you for a quick meal. Continue to have other snacks with protein and carbs. [facility] to [facility]. Check out [facility] Eating Well.

## 2016-11-24 DIAGNOSIS — J0100 Acute maxillary sinusitis, unspecified: Secondary | ICD-10-CM | POA: Diagnosis not present

## 2016-11-24 MED FILL — FLUTICASONE PROP 50 MCG SPR: 50 | 60 days supply | Qty: 16 | Fill #0

## 2016-11-24 MED FILL — AMOX-CLAV 500-125 MG TABLET: 500-125 | 10 days supply | Qty: 20 | Fill #0

## 2016-11-30 ENCOUNTER — Ambulatory Visit: Payer: Self-pay | Admitting: Dietician

## 2017-02-25 DIAGNOSIS — Z6841 Body Mass Index (BMI) 40.0 and over, adult: Secondary | ICD-10-CM | POA: Diagnosis not present

## 2017-02-25 DIAGNOSIS — R739 Hyperglycemia, unspecified: Secondary | ICD-10-CM | POA: Diagnosis not present

## 2017-02-25 DIAGNOSIS — Z0000 Encounter for general adult medical examination without abnormal findings: Secondary | ICD-10-CM | POA: Diagnosis not present

## 2017-02-25 DIAGNOSIS — D508 Other iron deficiency anemias: Secondary | ICD-10-CM | POA: Diagnosis not present

## 2017-02-25 DIAGNOSIS — Z87892 Personal history of anaphylaxis: Secondary | ICD-10-CM | POA: Diagnosis not present

## 2017-02-25 DIAGNOSIS — E669 Obesity, unspecified: Secondary | ICD-10-CM | POA: Diagnosis not present

## 2017-02-25 MED FILL — EPINEPHRINE 0.3 MG AUTO-INJ: 0.3 | 30 days supply | Qty: 2 | Fill #0

## 2017-03-31 DIAGNOSIS — R102 Pelvic and perineal pain: Secondary | ICD-10-CM | POA: Diagnosis not present

## 2017-03-31 DIAGNOSIS — R8290 Unspecified abnormal findings in urine: Secondary | ICD-10-CM | POA: Diagnosis not present

## 2017-03-31 DIAGNOSIS — D649 Anemia, unspecified: Secondary | ICD-10-CM | POA: Diagnosis not present

## 2017-04-01 MED FILL — CIPROFLOXACIN HCL 250 MG TA: 250 | 7 days supply | Qty: 14 | Fill #0

## 2017-04-01 MED FILL — NUVARING VAGINAL RING: 0.12-0.015 | 28 days supply | Qty: 1 | Fill #0

## 2017-04-29 MED FILL — NUVARING VAGINAL RING: 0.12-0.015 | 84 days supply | Qty: 3 | Fill #1

## 2017-10-20 DIAGNOSIS — R030 Elevated blood-pressure reading, without diagnosis of hypertension: Secondary | ICD-10-CM | POA: Diagnosis not present

## 2017-10-20 DIAGNOSIS — Z6841 Body Mass Index (BMI) 40.0 and over, adult: Secondary | ICD-10-CM | POA: Diagnosis not present

## 2017-10-20 DIAGNOSIS — Z01411 Encounter for gynecological examination (general) (routine) with abnormal findings: Secondary | ICD-10-CM | POA: Diagnosis not present

## 2017-10-22 DIAGNOSIS — I10 Essential (primary) hypertension: Secondary | ICD-10-CM | POA: Diagnosis not present

## 2017-10-22 DIAGNOSIS — D500 Iron deficiency anemia secondary to blood loss (chronic): Secondary | ICD-10-CM | POA: Diagnosis not present

## 2017-10-22 MED FILL — VALSARTAN-HCTZ 160-25 MG TA: 160-25 | 90 days supply | Qty: 90 | Fill #0

## 2017-10-27 ENCOUNTER — Ambulatory Visit: Payer: Self-pay | Admitting: Registered"

## 2017-10-28 ENCOUNTER — Encounter: Payer: Self-pay | Admitting: Registered"

## 2017-10-28 ENCOUNTER — Encounter: Payer: 59 | Attending: Family Medicine | Admitting: Registered"

## 2017-10-28 DIAGNOSIS — Z713 Dietary counseling and surveillance: Secondary | ICD-10-CM | POA: Insufficient documentation

## 2017-10-28 NOTE — Patient Instructions (Signed)
-   Increase physical activity with cardio and/or strength-training to 4 days/week:    Sun (7am), Tues (9pm), Thurs (6am), Sat (7am).   - Aim to not skip meals; aim to eat every 3-5 hours.   - Keep snacks of protein (nuts, tuna packs, greek yogurt, low-fat cheese) and carbohydrates (fruit, crackers, etc) handy to eat between meals.   - This weekend clean and organize refrigerator.   - Prepare meals and snacks for the upcoming week.

## 2017-10-28 NOTE — Progress Notes (Signed)
Medical Nutrition Therapy:  Appt start time: 2:05 end time: 3:05   Assessment:  Primary concerns today: [employer] employee.  Pt states she needs structure. 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 Pt states she is also anemic. Pt states she lives near a new gym that is open 24 hrs. Pt states she likes accountability. Pt states likes fruit, crackers, nuts as snacks. Pt states when she gets off track, its hard to get back on track. Pt reports what is eating quickly while at work and being on-the-go. Pt works at multiple job sites, some daytime hours and some nighttime hours. Pt states she tries keep snacks on hand in cooler that she keeps in her car. Pt states she meal preps on Saturdays for the upcoming week, unless she has family or friends visiting on the weekend. Pt states she knows what to do but has not been able to consistently do it. Pt states she wants to be healthy and live a long life. 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  Pt reports sleeping 4-10 hrs a day. Pt reports blood pressure has been higher in past few weeks.  Pt states she is lazy and does not want to prepare her foods. Pt states she would rather have someone make the food for her ahead of time where she could pick it up and eat it. Pt states she thought mashed potatoes, yogurt, dairy products, macaroni and cheese are bad for you.    Preferred Learning Style:   No preference indicated   Learning Readiness:   Contemplating  MEDICATIONS: See List   DIETARY INTAKE:  Usual eating pattern includes 2 meals and 1 snacks per day.  Everyday foods include none stated.  Avoided foods include: processed meats, allergic to tree nuts, rice   24-hr recall:  B ( AM): none or 2 packs flavored oatmeal with chia seeds and flax seeds Snk ( AM): none  L ( PM): [employer]-2 taco supremes (w/o sour cream)   Snk ( PM): none   D ( PM): [employer] - chicken bowls (corn, chicken, black beans)  Snk ( PM): none  Beverages: 48+ oz water, infused water, fruit juice, orange juice sometimes   Usual physical activity: none stated  Estimated energy needs: 1800 calories 200 g carbohydrates 135 g protein 50 g fat  Progress Towards Goal(s):  In progress.   Nutritional Diagnosis:  NB-1.1 Food and nutrition-related knowledge deficit As related to hx of unstructured meal schedule.  As evidenced by pt report and 24 hr recall.    Intervention:  Nutrition education and counseling. Pt was educated and counseled on creating healthy eating habits involving consistent meals throughout your day, implementing physical activity regimen, and getting back on schedule with meal-planning for the upcoming week.  Goals: - Increase physical activity with cardio and/or strength-training to 4 days/week:    Sun (7am), Tues (9pm), Thurs (6am), Sat (7am).  - Aim to not skip meals; aim to eat every 3-5 hours.  - Keep snacks of protein (nuts, tuna packs, greek yogurt, low-fat cheese) and carbohydrates (fruit, crackers, etc) handy to eat between meals.  - This weekend clean and organize refrigerator.  - Prepare meals and snacks for the upcoming week.   Teaching Method Utilized:  Visual Auditory  Handouts given during visit include:  None  Barriers to learning/adherence to lifestyle change:  Hectic work schedule  Demonstrated degree of understanding via:  Teach Back   Monitoring/Evaluation:  Dietary intake, exercise, and body weight prn.

## 2017-12-03 DIAGNOSIS — D500 Iron deficiency anemia secondary to blood loss (chronic): Secondary | ICD-10-CM | POA: Diagnosis not present

## 2017-12-03 DIAGNOSIS — I10 Essential (primary) hypertension: Secondary | ICD-10-CM | POA: Diagnosis not present

## 2018-01-05 MED FILL — AMLODIPINE BESYLATE 5 MG TA: 5 | 30 days supply | Qty: 30 | Fill #0

## 2018-01-05 MED FILL — HYDROCHLOROTHIAZIDE 25 MG T: 25 | 30 days supply | Qty: 30 | Fill #0

## 2018-02-07 MED FILL — AMLODIPINE BESYLATE 5 MG TA: 5 | 30 days supply | Qty: 30 | Fill #1

## 2018-02-07 MED FILL — HYDROCHLOROTHIAZIDE 25 MG T: 25 | 30 days supply | Qty: 30 | Fill #1

## 2018-03-03 DIAGNOSIS — R7301 Impaired fasting glucose: Secondary | ICD-10-CM | POA: Diagnosis not present

## 2018-03-03 DIAGNOSIS — Z0000 Encounter for general adult medical examination without abnormal findings: Secondary | ICD-10-CM | POA: Diagnosis not present

## 2018-03-03 DIAGNOSIS — Z87892 Personal history of anaphylaxis: Secondary | ICD-10-CM | POA: Diagnosis not present

## 2018-03-03 DIAGNOSIS — D500 Iron deficiency anemia secondary to blood loss (chronic): Secondary | ICD-10-CM | POA: Diagnosis not present

## 2018-03-03 DIAGNOSIS — I10 Essential (primary) hypertension: Secondary | ICD-10-CM | POA: Diagnosis not present

## 2018-03-03 MED FILL — HYDROCHLOROTHIAZIDE 25 MG T: 25 | 90 days supply | Qty: 90 | Fill #0

## 2018-03-03 MED FILL — AMLODIPINE BESYLATE 5 MG TA: 5 | 90 days supply | Qty: 90 | Fill #0

## 2018-06-24 MED FILL — AMLODIPINE BESYLATE 5 MG TA: 5 | 90 days supply | Qty: 90 | Fill #1

## 2018-06-24 MED FILL — HYDROCHLOROTHIAZIDE 25 MG T: 25 | 90 days supply | Qty: 90 | Fill #1

## 2018-07-18 DIAGNOSIS — L989 Disorder of the skin and subcutaneous tissue, unspecified: Secondary | ICD-10-CM | POA: Diagnosis not present

## 2018-09-05 MED FILL — TRIAMCINOLONE 0.1% OINTMENT: 0.1 | 14 days supply | Qty: 80 | Fill #0

## 2018-09-08 DIAGNOSIS — D500 Iron deficiency anemia secondary to blood loss (chronic): Secondary | ICD-10-CM | POA: Diagnosis not present

## 2018-09-08 DIAGNOSIS — I10 Essential (primary) hypertension: Secondary | ICD-10-CM | POA: Diagnosis not present

## 2018-09-08 DIAGNOSIS — Z6841 Body Mass Index (BMI) 40.0 and over, adult: Secondary | ICD-10-CM | POA: Diagnosis not present

## 2018-09-08 DIAGNOSIS — N921 Excessive and frequent menstruation with irregular cycle: Secondary | ICD-10-CM | POA: Diagnosis not present

## 2018-09-08 DIAGNOSIS — E1165 Type 2 diabetes mellitus with hyperglycemia: Secondary | ICD-10-CM | POA: Diagnosis not present

## 2018-09-08 DIAGNOSIS — E119 Type 2 diabetes mellitus without complications: Secondary | ICD-10-CM | POA: Diagnosis not present

## 2018-09-12 ENCOUNTER — Telehealth: Payer: Self-pay | Admitting: Hematology and Oncology

## 2018-09-12 NOTE — Telephone Encounter (Signed)
Spoke with patient re new patient appointment with Dr. Audelia Hives 9/27 @ 11 am to arrive 10:30 am. Demographic and insurance information confirmed.

## 2018-09-14 MED FILL — HYDROCHLOROTHIAZIDE 25 MG T: 25 | 30 days supply | Qty: 30 | Fill #2

## 2018-09-14 MED FILL — AMLODIPINE BESYLATE 5 MG TA: 5 | 30 days supply | Qty: 30 | Fill #2

## 2018-09-16 ENCOUNTER — Encounter: Payer: Self-pay | Admitting: Hematology and Oncology

## 2018-09-16 ENCOUNTER — Other Ambulatory Visit: Payer: Self-pay | Admitting: Hematology and Oncology

## 2018-09-16 ENCOUNTER — Telehealth: Payer: Self-pay | Admitting: Hematology and Oncology

## 2018-09-16 ENCOUNTER — Inpatient Hospital Stay: Payer: 59 | Attending: Hematology and Oncology | Admitting: Hematology and Oncology

## 2018-09-16 VITALS — BP 137/96 | HR 84 | Temp 98.6°F | Resp 19 | Ht 66.0 in | Wt 279.1 lb

## 2018-09-16 DIAGNOSIS — D509 Iron deficiency anemia, unspecified: Secondary | ICD-10-CM | POA: Diagnosis not present

## 2018-09-16 DIAGNOSIS — R42 Dizziness and giddiness: Secondary | ICD-10-CM | POA: Insufficient documentation

## 2018-09-16 DIAGNOSIS — D500 Iron deficiency anemia secondary to blood loss (chronic): Secondary | ICD-10-CM | POA: Insufficient documentation

## 2018-09-16 DIAGNOSIS — Z803 Family history of malignant neoplasm of breast: Secondary | ICD-10-CM | POA: Insufficient documentation

## 2018-09-16 DIAGNOSIS — I10 Essential (primary) hypertension: Secondary | ICD-10-CM | POA: Diagnosis not present

## 2018-09-16 DIAGNOSIS — Z720 Tobacco use: Secondary | ICD-10-CM | POA: Insufficient documentation

## 2018-09-16 NOTE — Progress Notes (Signed)
[facility] Initial Consultation  Patient Name:  Kimberly Coleman  DOB: 08/15/1971   Date of Service: September 16, 2018  Referring Provider: Marda Stalker, [place], [place] [address]   Consulting Physician: Henreitta Leber, MD Hematology/Oncology  Patient Care Team: Patient Care Team: Marda Stalker, PA-C as PCP - General (Family Medicine)   Reason for Referral: In the setting of iron deficiency anemia, likely secondary to long-standing menorrhagia, currently on ferrous sulfate once daily, she presents now for further diagnostic and therapeutic recommendations.  History Present Illness: Kimberly Coleman is a 47 year old single, nulliparous African-American resident of [place] whose past medical history is significant for primary hypertension; uterine fibroids; irregular menstrual cycle with menorrhagia; and an elevated BMI. Her primary care provider is Marda Stalker, physician assistant.  She sees a gynecologist on a regularly scheduled basis.  Beginning in her 28s she was on oral contraception due to an irregular menstrual cycle.  Most recently she has been on the etonogestrel-ethinyl estradiol vaginal ring which she discontinued nearly 5 months ago.  Her menstrual periods are irregular, possibly every 1-3 months.  Her periods can last 5-30 days on occasion.  In the past she has had both ultrasound and CT imaging.  No personal history of cancer.  Her mother had early stage breast cancer at the age of 23 years.  She is 47 years old at present.  Intermittently she has been on ferrous sulfate: 325 mg once daily.  Her intake has been sporadic.  The iron supplements are associated with mild to moderate constipation.    Over the past several weeks, she complains of dizziness, lightheadedness, and near syncope with positional changes and strenuous activity.  In addition she complains of intermittent mild numbness on the  dorsum of her feet bilaterally.  Although her appetite has been slightly diminished, her weight remains unchanged. Her most recent laboratory studies from September 19 at the [facility] revealed hemoglobin 8.2 hematocrit 25.5 MCV 63.3 MCH 20.1 RDW 16.8 WBC 8.7 with 71% neutrophils 21% lymphocytes 5% monocytes 3% eosinophils 1% basophil; platelets 378,000.  Serum iron <10, total iron binding capacity 442, and iron saturation 2%.  She has never had parenteral iron.  She has never been transfused.  She has no diabetes mellitus, coronary artery disease, or cardiac dysrhythmia.  She reports no seizure disorder or stroke syndrome.  There is no thyroid disease.  She has no peptic ulcer or gastroesophageal reflux disease.  She denies viral hepatitis, inflammatory bowel disease, or symptomatic diverticulosis.  She has no bone, joint, or muscle pain.  There is no rheumatoid or gouty arthritis.  She has no prior associated blood disorder.  She has no numbness or tingling in her fingers.  Her weight remains stable.  She denies headache, or syncope.  She denies any visual changes or hearing deficit.  There is no rash or itching.  She denies any unusual cough, sore throat, or orthopnea.  She denies hemoptysis or hematochezia.  She has no nausea, vomiting, or diarrhea.  With iron supplements, she is constipated and now moves her bowels incompletely once daily.  She normally has 3-4 bowel movements daily.  She denies heartburn or indigestion.  She has no melena or bright red blood per rectum.  There is no urinary frequency, urgency, hematuria, or dysuria.  She denies swelling of her ankles.  It is with this background she presents now for further diagnostic and therapeutic recommendations in the setting of likely isolated iron  deficiency anemia, likely secondary to irregular and heavy uterine bleeding associated with menstruation, unresponsive and intolerant of oral iron supplements as mentioned above.  Past Medical History:   Diagnosis Date  . Anemia   . Fibroid 1997   Surgical History: Past Surgical History:  Procedure Laterality Date  . DILATION AND CURETTAGE OF UTERUS  2002   abn bleeding   Gynecologic History: Letisha is nulliparous. Her menarche was at age 38 years. Her last menses was the end of August. Since her early 27s she was on oral contraception to help regulate her menses. She is not on contraception at the present.  Her last mammogram was one year earlier reportedly normal. She has no prior breast biopsy.   Her last Pap smear was in 2017 reportedly normal.  Family History  Problem Relation Age of Onset  . Diabetes Mother   . Hypertension Father   . Cancer Other   Mother: Age 50 years: Breast cancer/DM/G6PD deficiency Father: Age 41 years: HTN/hypercholesterolemia She has 1 sister with a history of abnormal uterine bleeding; status post hysterectomy for uterine fibroids. There is no history of any blood disorder in the immediate family.  Social History   Socioeconomic History  . Marital status: Single    Spouse name: Not on file  . Number of children: Not on file  . Years of education: Not on file  . Highest education level: Not on file  Occupational History  . Not on file  Social Needs  . Financial resource strain: Not on file  . Food insecurity:    Worry: Never true    Inability: Never true  . Transportation needs:    Medical: Not on file    Non-medical: Not on file  Tobacco Use  . Smoking status: Current Some Day Smoker  Substance and Sexual Activity  . Alcohol use: Yes  . Drug use: No  . Sexual activity: Not Currently    Birth control/protection: Inserts  Lifestyle  . Physical activity:    Days per week: Not on file    Minutes per session: Not on file  . Stress: Not on file  Relationships  . Social connections:    Talks on phone: Not on file    Gets together: Not on file    Attends religious service: Not on file    Active member of club or organization:  Not on file    Attends meetings of clubs or organizations: Not on file    Relationship status: Not on file  . Intimate partner violence:    Fear of current or ex partner: Not on file    Emotionally abused: Not on file    Physically abused: Not on file    Forced sexual activity: Not on file  Other Topics Concern  . Not on file  Social History Narrative  . 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 She smokes cigarettes infrequently only with out-of-town friends. She has no recreational drug use. Her alcohol intake is infrequent (every 2 weeks) usually vodka; never heavy.  Transfusion History: No prior transfusion  Exposure History: She has no known exposure to toxic chemicals, radiation, or pesticides.  Allergies  Allergen Reactions  . Other Anaphylaxis, Shortness Of Breath and Itching    CASHEWS  . Percocet [Oxycodone-Acetaminophen] Nausea And Vomiting  She has nonspecific seasonal allergies  Current Outpatient Medications on File Prior to Visit  Medication Sig  . amLODipine (NORVASC) 5 MG tablet Take 5 mg by mouth  daily.  . hydrochlorothiazide (HYDRODIURIL) 25 MG tablet Take 25 mg by mouth every morning.  Marland Kitchen ibuprofen (ADVIL,MOTRIN) 200 MG tablet Take 400 mg by mouth every 6 (six) hours as needed for moderate pain.   Marland Kitchen b complex vitamins capsule Take 1 capsule by mouth daily.    Marland Kitchen etonogestrel-ethinyl estradiol (NUVARING) 0.12-0.015 MG/24HR vaginal ring Place 1 each vaginally every 28 (twenty-eight) days. Insert vaginally and leave in place for 3 consecutive weeks, then remove for 1 week.   No current facility-administered medications on file prior to visit.     Review of Systems: Constitutional: No fever, sweats, or shaking chills.  Mild appetite deficit over the past 12 months; no weight deficit; elevated BMI; energy level diminished. Skin: No rash, scaling, sores, lumps, or jaundice. HEENT: No visual changes or hearing deficit. Pulmonary: No  unusual cough, sore throat, or orthopnea. Breasts: No complaints. Cardiovascular: No coronary artery disease, angina, or myocardial infarction.  No cardiac dysrhythmia or dyslipidemia; primary hypertension. Gastrointestinal: No indigestion, dysphagia, abdominal pain, diarrhea; mild constipation on oral iron.  Genitourinary: No urinary frequency, urgency, hematuria, or dysuria. Musculoskeletal: No arthralgias or myalgias; no joint swelling, pain, or instability. Hematologic: No history of sickle cell disease or trait.  No easy bruisability. Endocrine: Intolerant to cold; no intolerance to heat; no thyroid disease or diabetes mellitus. Vascular: No peripheral arterial or venous thromboembolic disease. Psychological: No anxiety, depression, or mood changes; no mental health illnesses. Neurological: Positional dizziness, lightheadedness, and near-syncope; no syncopal episodes, passing out or loss of consciousness.  Intermittent nonpainful numbness of the dorsum of her feet over the past 2 weeks; no numbness or tingling in the fingers or hands.  Physical Examination: Vital Signs: Body surface area is 2.43 meters squared.  Vitals:   09/16/18 1103  BP: (!) 137/96  Pulse: 84  Resp: 19  Temp: 98.6 F (37 C)  SpO2: 100%    Filed Weights   09/16/18 1103  Weight: 126.6 kg   Constitutional:  [place] is fully nourished and developed albeit overweight African-American.  She looks age appropriate.  She is friendly and cooperative without respiratory compromise at rest. Skin: No rashes, scaling, dryness, jaundice, or itching. HEENT: Head is normocephalic and atraumatic.  Pupils are equal round and reactive to light and accommodation.  Sclerae are anicteric.  Conjunctivae are pale.  No sinus tenderness nor oropharyngeal lesions.  Lips without cracking or peeling; tongue without mass, inflammation, or nodularity.  Mucous membranes are moist. Neck: Full, supple and symmetric.  No jugular venous  distention or thyromegaly.  Trachea is midline. Lymphatics: No cervical or supraclavicular lymphadenopathy.  No epitrochlear, axillary, or inguinal lymphadenopathy is appreciated. Respiratory/chest: Thorax is symmetrical.  Breath sounds are clear to auscultation and percussion.  Normal excursion and respiratory effort. Back: Symmetric without deformity or tenderness. Cardiovascular: Heart rate and rhythm are regular without murmurs or gallops. Gastrointestinal: Abdomen is obese, soft, nontender; no organomegaly.  Bowel sounds are normoactive.  No masses are appreciated. Rectal examination: Not performed. Extremities: In the lower extremities, there is no asymmetric swelling, erythema, tenderness, or cord formation.  No clubbing, cyanosis, nor edema. Hematologic: No petechiae, hematomas, or ecchymoses. Psychological She is oriented to person, place, and time; normal affect, memory, and cognition. Neurological: There are no gross neurologic deficits.  Laboratory Results: I have reviewed the data as listed: September 08, 2018: A complete blood count shows hemoglobin 8.2 hematocrit 27.5 MCV 67.3 MCH 20.1 RDW 16.8 WBC 8.7 with 71% neutrophils 21% lymphocytes 5% monocytes 3% eosinophils  1% basophils; platelets 378,000.  A comprehensive metabolic panel showed sodium 138 potassium 4.2 chloride 103 CO2 30 glucose 90 BUN 15 creatinine 0.7 calcium 9.5 total protein 7.4 albumin 4.0 total bilirubin 0.5 SGOT 15 SGPT 13 alkaline phosphatase 61.  Serum iron <10; iron saturation 2%; TIBC 442 (Lab [facility]).  The results of her laboratory studies from today were not available at the time of discharge.  Diagnostic/Imaging Studies: January 17, 2016 DIGITAL DIAGNOSTIC LEFT MAMMOGRAM WITH 3D TOMOSYNTHESIS AND CAD  COMPARISON:  Screening mammogram dated 01/09/2016.  ACR Breast Density Category b: There are scattered areas of fibroglandular density.  FINDINGS: Cc and MLO tomosynthesis  was performed of the left breast. The initially questioned left breast asymmetry resolves on the additional imaging with findings compatible with overlapping tissue. There is no mammographic evidence of malignancy in the left breast.  Mammographic images were processed with CAD.  IMPRESSION: Initially questioned left breast asymmetry resolves on the additional imaging with findings compatible with overlapping tissue. There is no mammographic evidence of malignancy in the left breast.  RECOMMENDATION: Screening mammogram in one year.(Code:SM-B-01Y)  I have discussed the findings and recommendations with the patient. Results were also provided in writing at the conclusion of the visit. If applicable, a reminder letter will be sent to the patient regarding the next appointment.  BI-RADS CATEGORY  1: Negative.  Everlean Alstrom M.D. 01/17/2016 07:43  January 09, 2016 DIGITAL SCREENING BILATERAL MAMMOGRAM WITH CAD  COMPARISON:  None  ACR Breast Density Category b: There are scattered areas of fibroglandular density.  FINDINGS: In the left breast, a possible asymmetry warrants further evaluation. In the right breast, no findings suspicious for malignancy. Images were processed with CAD.  IMPRESSION: Further evaluation is suggested for possible asymmetry in the left breast.  RECOMMENDATION: Diagnostic mammogram and possibly ultrasound of the left breast. (Code:FI-L-43M)  The patient will be contacted regarding the findings, and additional imaging will be scheduled.  BI-RADS CATEGORY  0: Incomplete. Need additional imaging evaluation and/or prior mammograms for comparison.  Curlene Dolphin M.D. 01/09/2016 08:53  August 06, 2014 EXAM: CT ABDOMEN AND PELVIS WITH CONTRAST  TECHNIQUE: Multidetector CT imaging of the abdomen and pelvis was performed using the standard protocol following bolus administration of intravenous contrast. Sagittal and coronal  MPR images reconstructed from axial data set.  CONTRAST:  186mL OMNIPAQUE IOHEXOL 300 MG/ML SOLN IV. Dilute oral contrast.  COMPARISON:  None; correlation made with pelvic ultrasound of 08/06/2014  FINDINGS: Lung bases clear.  Liver, spleen, pancreas, kidneys, and adrenal glands normal.  Normal appendix.  Uterus appears mildly enlarged with an enhancing nodular focus in the LEFT lateral uterus 2.8 x 2.0 cm image 71, not seen on preceding ultrasound.  Low-attenuation focus more inferiorly in uterus 2.1 x 1.7 cm, question low-attenuation leiomyoma versus prominent endometrial canal identified on ultrasound.  Small amount of nonspecific cul de sac free fluid.  Unremarkable adnexae, bladder and ureters.  Stomach and bowel loops normal appearance.  Specifically no sigmoid diverticula or inflammatory changes to suggest diverticulitis or colitis.  No mass, adenopathy, free fluid or free air.  Bones unremarkable.  IMPRESSION: Small probable uterine leiomyoma with additional focus of low attenuation which could represent an additional leiomyoma or be related to mildly prominent endometrial complex identified on preceding ultrasound.  Small amount of nonspecific free pelvic fluid.  Otherwise negative exam.  Lavonia Dana M.D. 08/06/2014 12:47  Summary/Assessment: 1.  In the setting of likely isolated iron deficiency anemia, likely  secondary to irregular and heavy uterine bleeding associated with menstruation, unresponsive and intolerant of oral iron supplements, she presents now for further diagnostic and therapeutic recommendations.   2. Over the past several weeks, she complains of dizziness, lightheadedness, and near syncope with positional changes and strenuous activity.  In addition she complains of intermittent mild numbness on the dorsum of her feet bilaterally.  Although her appetite has been slightly diminished, her weight remains unchanged.  3.  Her most recent laboratory studies from September 19 at the [facility] revealed hemoglobin 8.2 hematocrit 25.5 MCV 63.3 MCH 20.1 RDW 16.8 WBC 8.7 with 71% neutrophils 21% lymphocytes 5% monocytes 3% eosinophils 1% basophil; platelets 378,000.  Serum iron <10, total iron binding capacity 442, and iron saturation 2%.  She has never had parenteral iron.  She has never been transfused.  4. She has no diabetes mellitus, coronary artery disease, or cardiac dysrhythmia.  She reports no seizure disorder or stroke syndrome.  There is no thyroid disease.  She has no peptic ulcer or gastroesophageal reflux disease.  She denies viral hepatitis, inflammatory bowel disease, or symptomatic diverticulosis.  She has no bone, joint, or muscle pain.  There is no rheumatoid or gouty arthritis.  She has no prior associated blood disorder.  She has no numbness or tingling in her fingers.  5. Her weight remains stable.  She denies headache, or syncope.  She denies any visual changes or hearing deficit.  There is no rash or itching.  She denies any unusual cough, sore throat, or orthopnea.  She denies hemoptysis or hematochezia.  She has no nausea, vomiting, or diarrhea.  With iron supplements, she is constipated and now moves her bowels incompletely once daily.  She normally has 3-4 bowel movements daily.  She denies heartburn or indigestion.  She has no melena or bright red blood per rectum.  There is no urinary frequency, urgency, hematuria, or dysuria.  She denies swelling of her ankles.  6.  Her other comorbid problems include  primary hypertension; uterine fibroids; irregular menstrual cycle with menorrhagia; and an elevated BMI. Beginning in her 66s she was on oral contraception due to an irregular menstrual cycle.  Most recently she has been on the etonogestrel-ethinyl estradiol vaginal ring which she discontinued nearly 5 months ago.  Her menstrual periods are irregular, possibly every 1-3 months.  Her periods can last  5-30 days on occasion.  In the past she has had both ultrasound and CT imaging.  She is followed by gynecology. Intermittently she has been on ferrous sulfate: 325 mg once daily.  Her intake has been sporadic.  The iron supplements are associated with mild to moderate constipation.    Recommendation/Plan: 1.  The results of her laboratory studies from today were not available at the time of discharge.  They will be discussed in detail at the time of her next visit.  2.  We did discuss in detail the results of her laboratory studies from [facility] on September 19.  Those results are detailed above suggest iron deficiency anemia, likely due to irregular, but heavy menstrual periods.  She has been taking sporadically ferrous sulfate: 1 tablet once daily over an indeterminate period.  3.  We discussed also the possibility of infusional iron should her repeat iron studies show persistent iron deficiency.  With ferrous sulfate once daily, she has mild constipation making it unlikely that she will tolerate 2-3 times daily dosing of iron by mouth.  4.  We discussed in detail  the role, rationale, expectation, and potential side effect profile of parenteral iron.  Those potential side effects include but are not exclusive of, changes in blood pressure, dizziness, nausea, and skin rash.  Uncommonly, hypersensitivity reactions can occur (<1%).  In general, ferumoytol is well-tolerated.  5.  She was advised that the infusion takes only 15 minutes but that a 30-minute period of observation afterward is part of our protocol.  Questions were answered to her satisfaction.  6.  Tahara was advised to continue vitamin B complex daily as an adjunct to parenteral iron.  7.  It was highly recommended that she continue close gynecologic follow-up as previously arranged.  8.  She is scheduled now for her first dose of parenteral iron on October 4 pending authorization.  9.  Barring any unforeseen complications, her  next scheduled doctor visit with laboratory studies is on October 17 with recommendations to follow.  The total time spent discussing the previous laboratory studies, methodology for evaluating the anemia of iron deficiency and excluding any potential confounding factors, preliminary considerations with recommendations was 50 minutes.  At least 50% of that time was spent in discussion, reviewing outside records, laboratory evaluation, counseling, and answering questions. All questions were answered to her satisfaction. She knows to call the Clinic with any problems, questions, or concerns.  This note was dictated using voice activated technology/software.  Unfortunately, typographical errors are not uncommon, and transcription is subject to mistakes and regrettably misinterpretation.  If necessary, clarification of the above information can be discussed with me at any time.  Thank you Loma Sousa for allowing my participation in the care of [facility]. I will keep you closely informed as the results of her preliminary laboratory data become available.  Please do not hesitate to call should any questions arise regarding this initial consultation and discussion.  FOLLOW UP: AS DIRECTED   cc:         Marda Stalker, PA-C  Henreitta Leber, MD  Hematology/Oncology [place] [address] [place], [place] [address] Office: [phone] GBTD: [phone]

## 2018-09-16 NOTE — Patient Instructions (Signed)
September 16, 2018 We discussed in detail the results of your laboratory studies from [place] on September 19.  Those results suggest iron deficiency anemia likely due to irregular but heavy menstrual periods.  It is possible that she will need more than 1 iron infusion.  Although generally well tolerated, infusional iron has been associated with irritation of the vein and infrequently allergic reactions.  As part of our protocol we observe you for 30 minutes after completion of iron.  Because of mild constipation, it is not necessary to take oral iron while receiving iron infusions.  It is recommended that you continue vitamin B complex daily as an adjunct to intravenous iron.  It is important to continue close gynecologic follow-up as previously arranged.  Laboratory studies from today were requested but not available at the time of discharge.  Those results will be discussed in detail the time of your next visit.  We have scheduled you for an iron infusion on October 4.  Barring any unforeseen complications, your next scheduled doctor visit with laboratory studies before that visit is on October 17.  Thank you!  Ladona Ridgel, MD Hematology/Oncology

## 2018-09-16 NOTE — Telephone Encounter (Signed)
Scheduled appt per 9/27 sch message- lab on 9/30 - unable to reach patient - left message with appt date and time.

## 2018-09-18 ENCOUNTER — Other Ambulatory Visit: Payer: Self-pay | Admitting: Hematology and Oncology

## 2018-09-19 ENCOUNTER — Inpatient Hospital Stay: Payer: 59

## 2018-09-19 ENCOUNTER — Other Ambulatory Visit: Payer: Self-pay | Admitting: Hematology and Oncology

## 2018-09-19 DIAGNOSIS — R42 Dizziness and giddiness: Secondary | ICD-10-CM | POA: Diagnosis not present

## 2018-09-19 DIAGNOSIS — Z720 Tobacco use: Secondary | ICD-10-CM | POA: Diagnosis not present

## 2018-09-19 DIAGNOSIS — Z803 Family history of malignant neoplasm of breast: Secondary | ICD-10-CM | POA: Diagnosis not present

## 2018-09-19 DIAGNOSIS — I10 Essential (primary) hypertension: Secondary | ICD-10-CM | POA: Diagnosis not present

## 2018-09-19 DIAGNOSIS — D500 Iron deficiency anemia secondary to blood loss (chronic): Secondary | ICD-10-CM

## 2018-09-19 DIAGNOSIS — D509 Iron deficiency anemia, unspecified: Secondary | ICD-10-CM | POA: Diagnosis not present

## 2018-09-19 LAB — CBC WITH DIFFERENTIAL (CANCER CENTER ONLY)
BASOS PCT: 1 %
Basophils Absolute: 0.1 10*3/uL (ref 0.0–0.1)
Eosinophils Absolute: 0.1 10*3/uL (ref 0.0–0.5)
Eosinophils Relative: 1 %
HEMATOCRIT: 27.9 % — AB (ref 34.8–46.6)
Hemoglobin: 8.3 g/dL — ABNORMAL LOW (ref 11.6–15.9)
Lymphocytes Relative: 21 %
Lymphs Abs: 2 10*3/uL (ref 0.9–3.3)
MCH: 19.8 pg — ABNORMAL LOW (ref 25.1–34.0)
MCHC: 29.9 g/dL — AB (ref 31.5–36.0)
MCV: 66.3 fL — ABNORMAL LOW (ref 79.5–101.0)
MONO ABS: 0.6 10*3/uL (ref 0.1–0.9)
Monocytes Relative: 6 %
NEUTROS ABS: 6.8 10*3/uL — AB (ref 1.5–6.5)
Neutrophils Relative %: 71 %
Platelet Count: 385 10*3/uL (ref 145–400)
RBC: 4.2 MIL/uL (ref 3.70–5.45)
RDW: 17.6 % — AB (ref 11.2–14.5)
WBC Count: 9.5 10*3/uL (ref 3.9–10.3)

## 2018-09-19 LAB — RETICULOCYTES
RBC.: 4.21 MIL/uL (ref 3.70–5.45)
Retic Count, Absolute: 46.3 10*3/uL (ref 33.7–90.7)
Retic Ct Pct: 1.1 % (ref 0.7–2.1)

## 2018-09-19 LAB — FERRITIN: Ferritin: 4 ng/mL — ABNORMAL LOW (ref 11–307)

## 2018-09-19 NOTE — Addendum Note (Signed)
Addended by: Henreitta Leber on: 09/19/2018 11:22 AM   Modules accepted: Orders

## 2018-09-21 LAB — HEMOGLOBINOPATHY EVALUATION
HGB VARIANT: 0 %
Hgb A2 Quant: 1.6 % — ABNORMAL LOW (ref 1.8–3.2)
Hgb A: 98.4 % (ref 96.4–98.8)
Hgb C: 0 %
Hgb F Quant: 0 % (ref 0.0–2.0)
Hgb S Quant: 0 %

## 2018-09-23 ENCOUNTER — Inpatient Hospital Stay: Payer: 59 | Attending: Hematology and Oncology

## 2018-09-23 VITALS — BP 114/73 | HR 69 | Temp 98.1°F | Resp 17

## 2018-09-23 DIAGNOSIS — I10 Essential (primary) hypertension: Secondary | ICD-10-CM | POA: Diagnosis not present

## 2018-09-23 DIAGNOSIS — D500 Iron deficiency anemia secondary to blood loss (chronic): Secondary | ICD-10-CM | POA: Insufficient documentation

## 2018-09-23 DIAGNOSIS — N938 Other specified abnormal uterine and vaginal bleeding: Secondary | ICD-10-CM

## 2018-09-23 DIAGNOSIS — Z79899 Other long term (current) drug therapy: Secondary | ICD-10-CM | POA: Insufficient documentation

## 2018-09-23 DIAGNOSIS — N920 Excessive and frequent menstruation with regular cycle: Secondary | ICD-10-CM | POA: Diagnosis not present

## 2018-09-23 DIAGNOSIS — Z803 Family history of malignant neoplasm of breast: Secondary | ICD-10-CM | POA: Insufficient documentation

## 2018-09-23 MED ORDER — SODIUM CHLORIDE 0.9 % IV SOLN
510.0000 mg | Freq: Once | INTRAVENOUS | Status: AC
  Administered 2018-09-23: 510 mg via INTRAVENOUS
  Filled 2018-09-23: qty 17

## 2018-09-23 MED ORDER — SODIUM CHLORIDE 0.9 % IV SOLN
Freq: Once | INTRAVENOUS | Status: AC
  Administered 2018-09-23: 09:00:00 via INTRAVENOUS
  Filled 2018-09-23: qty 250

## 2018-09-23 MED FILL — HYDROCHLOROTHIAZIDE 25 MG T: 25 | 30 days supply | Qty: 30 | Fill #2

## 2018-09-23 MED FILL — AMLODIPINE BESYLATE 5 MG TA: 5 | 30 days supply | Qty: 30 | Fill #2

## 2018-09-23 NOTE — Patient Instructions (Signed)

## 2018-10-05 ENCOUNTER — Other Ambulatory Visit: Payer: Self-pay | Admitting: Hematology and Oncology

## 2018-10-05 ENCOUNTER — Telehealth: Payer: Self-pay | Admitting: Hematology and Oncology

## 2018-10-05 DIAGNOSIS — D500 Iron deficiency anemia secondary to blood loss (chronic): Secondary | ICD-10-CM

## 2018-10-05 NOTE — Progress Notes (Deleted)
[facility] Outpatient Progress Note  Patient Name:  Kimberly Coleman  DOB: Feb 14, 1971   Date of Service: October 06, 2018  Referring Provider: Marda Stalker, PA-C [place], [place] [address]   Consulting Physician: Henreitta Leber, MD Hematology/Oncology  Reason for Visit: In the setting of iron deficiency anemia, likely secondary to long-standing menorrhagia, previously failed and intolerant to ferrous sulfate, she presents now following parenteral iron on October 4, for laboratory studies and reevaluation.  Hematology History: September 19, 2018: In the setting of iron deficiency anemia, likely secondary to long-standing menorrhagia, on sporadic ferrous sulfate once daily, she presented initially for further diagnostic and therapeutic recommendations.  CBC: Hemoglobin 8.3 hematocrit 29.9 MCV 66 MCH 19.8 RDW 17.6 WBC 9.5 platelets 385,000. Reticulocyte count 1.1% Ferritin 4 Hemoglobin electrophoresis: No evidence of sickle disease or trait.  Brief History: Kimberly Coleman is a 47 year old single, nulliparous African-American resident of [place] whose past medical history is significant for primary hypertension; uterine fibroids; irregular menstrual cycle with menorrhagia; and an elevated BMI. Her primary care provider is Marda Stalker, physician assistant.  She sees a gynecologist on a regularly scheduled basis.  Beginning in her 21s she was on oral contraception due to an irregular menstrual cycle.  Most recently she has been on the etonogestrel-ethinyl estradiol vaginal ring which she discontinued nearly 5 months ago.  Her menstrual periods are irregular, possibly every 1-3 months.  Her periods can last 5-30 days on occasion.  In the past she has had both ultrasound and CT imaging.  No personal history of cancer.  Her mother had early stage breast cancer at the age of 67 years.  She is 47 years old at present.   Intermittently she has been on ferrous sulfate: 325 mg once daily.  Her intake has been sporadic.  The iron supplements are associated with mild to moderate constipation.     Her recent laboratory studies from September 19 at the [facility] revealed hemoglobin 8.2 hematocrit 25.5 MCV 63.3 MCH 20.1 RDW 16.8 WBC 8.7 with 71% neutrophils 21% lymphocytes 5% monocytes 3% eosinophils 1% basophil; platelets 378,000.  Serum iron <10, total iron binding capacity 442, and iron saturation 2%.  She has never had parenteral iron.  She has never been transfused.  Her last menses was at the end of August.  Since her early 57s she has been on oral contraception to help regulate her menses.  She is not on contraception at the present.  [name] is nulliparous.She has no history of any blood disorders in her family. She had been on ferrous sulfate: 325 mg once daily.  Her intake has been sporadic due to constipation.   Over the past several weeks, she complains of dizziness, lightheadedness, and near syncope with positional changes and strenuous activity.  In addition she complains of intermittent mild numbness on the dorsum of her feet bilaterally.  Although her appetite has been slightly diminished, her weight remains unchanged.  It is with this background she presents now following iron replacement on October 4 in the setting of isolated iron deficiency anemia, likely secondary to irregular and heavy uterine bleeding associated with menstruation, unresponsive and intolerant of oral iron supplements as mentioned above.  Interval History: In the interim since her last visit, both her appetite and weight remain stable. She has no fever, shaking chills, sweats, or flulike symptoms.  She reports no rash or itching.  There is no pain or difficulty in swallowing. She denies headache or  syncope.  She denies any visual changes or hearing deficit.  There is no rash or itching.  She denies any unusual cough, sore throat, or  orthopnea.    She has no chest or abdominal pain.  There is no epistaxis or hemoptysis.  She has no nausea, vomiting, or diarrhea.  With iron supplements, she is constipated and now moves her bowels incompletely once daily.  She normally has 3-4 bowel movements daily.  She denies heartburn or indigestion.  She has no melena or bright red blood per rectum. There is no urinary frequency, urgency, hematuria, or dysuria.  She denies swelling of her ankles.  There are no new areas of bone, joint, muscle pain.  She has no numbness or tingling i n her fingers.   Past Medical History Reviewed        Family History Reviewed       Social History Reviewed  Allergies  Allergen Reactions  . Other Anaphylaxis, Shortness Of Breath and Itching    CASHEWS  . Percocet [Oxycodone-Acetaminophen] Nausea And Vomiting   Current Outpatient Medications on File Prior to Visit  Medication Sig  . amLODipine (NORVASC) 5 MG tablet Take 5 mg by mouth daily.  Marland Kitchen b complex vitamins capsule Take 1 capsule by mouth daily.    Marland Kitchen etonogestrel-ethinyl estradiol (NUVARING) 0.12-0.015 MG/24HR vaginal ring Place 1 each vaginally every 28 (twenty-eight) days. Insert vaginally and leave in place for 3 consecutive weeks, then remove for 1 week.  . hydrochlorothiazide (HYDRODIURIL) 25 MG tablet Take 25 mg by mouth every morning.  Marland Kitchen ibuprofen (ADVIL,MOTRIN) 200 MG tablet Take 400 mg by mouth every 6 (six) hours as needed for moderate pain.    No current facility-administered medications on file prior to visit.     Review of Systems: Constitutional: No fever, sweats, or shaking chills.  Mild appetite deficit over the past 12 months; no weight deficit; elevated BMI; energy level diminished. Skin: No rash, scaling, sores, lumps, or jaundice. HEENT: No visual changes or hearing deficit. Pulmonary: No unusual cough, sore throat, or orthopnea. Breasts: No complaints. Cardiovascular: No coronary artery disease, angina, or myocardial infarction.   No cardiac dysrhythmia or dyslipidemia; primary hypertension. Gastrointestinal: No indigestion, dysphagia, abdominal pain, diarrhea; mild constipation on oral iron.  Genitourinary: No urinary frequency, urgency, hematuria, or dysuria. Musculoskeletal: No arthralgias or myalgias; no joint swelling, pain, or instability. Hematologic: No history of sickle cell disease or trait.  No easy bruisability. Endocrine: Intolerant to cold; no intolerance to heat; no thyroid disease or diabetes mellitus. Vascular: No peripheral arterial or venous thromboembolic disease. Psychological: No anxiety, depression, or mood changes; no mental health illnesses. Neurological: Positional dizziness, lightheadedness, and near-syncope; no syncopal episodes, passing out or loss of consciousness.  Intermittent nonpainful numbness of the dorsum of her feet over the past 2 weeks; no numbness or tingling in the fingers or hands.  Physical Examination: Vital Signs: There is no height or weight on file to calculate BSA. There were no vitals filed for this visit. There were no vitals filed for this visit.There is no height or weight on file to calculate BMI. Constitutional:  Azelyn Batie is a fully nourished and developed albeit overweight African-American.  She looks age appropriate.  She is friendly and cooperative without respiratory compromise at rest. Skin: No rashes, scaling, dryness, jaundice, or itching. HEENT: Head is normocephalic and atraumatic.  Pupils are equal round and reactive to light and accommodation.  Sclerae are anicteric.  Conjunctivae are pale.  No sinus tenderness  nor oropharyngeal lesions.  Lips without cracking or peeling; tongue without mass, inflammation, or nodularity.  Mucous membranes are moist. Neck: Full, supple and symmetric.  No jugular venous distention or thyromegaly.  Trachea is midline. Lymphatics: No cervical or supraclavicular lymphadenopathy.  No epitrochlear, axillary, or inguinal  lymphadenopathy is appreciated. Respiratory/chest: Thorax is symmetrical.  Breath sounds are clear to auscultation and percussion.  Normal excursion and respiratory effort. Back: Symmetric without deformity or tenderness. Cardiovascular: Heart rate and rhythm are regular without murmurs or gallops. Gastrointestinal: Abdomen is obese, soft, nontender; no organomegaly.  Bowel sounds are normoactive.  No masses are appreciated. Rectal examination: Not performed. Extremities: In the lower extremities, there is no asymmetric swelling, erythema, tenderness, or cord formation.  No clubbing, cyanosis, nor edema. Hematologic: No petechiae, hematomas, or ecchymoses. Psychological She is oriented to person, place, and time; normal affect, memory, and cognition. Neurological: There are no gross neurologic deficits.  Laboratory Results: CBC Latest Ref Rng & Units 09/19/2018 08/06/2014 10/08/2011  WBC 3.9 - 10.3 K/uL 9.5 20.9(H) 9.8  Hemoglobin 11.6 - 15.9 g/dL 8.3(L) 10.1(L) 7.6(L)  Hematocrit 34.8 - 46.6 % 27.9(L) 32.7(L) 26.6(L)  Platelets 145 - 400 K/uL 385 353 431(H)  Reticulocyte count 1.1% Ferritin 4 Hemoglobin electrophoresis: No evidence of sickle disease or trait.  September 08, 2018: A complete blood count shows hemoglobin 8.2 hematocrit 27.5 MCV 67.3 MCH 20.1 RDW 16.8 WBC 8.7 with 71% neutrophils 21% lymphocytes 5% monocytes 3% eosinophils 1% basophils; platelets 378,000.  A comprehensive metabolic panel showed sodium 138 potassium 4.2 chloride 103 CO2 30 glucose 90 BUN 15 creatinine 0.7 calcium 9.5 total protein 7.4 albumin 4.0 total bilirubin 0.5 SGOT 15 SGPT 13 alkaline phosphatase 61.  Serum iron <10; iron saturation 2%; TIBC 442 (Lab [facility]).  Diagnostic/Imaging Studies: January 17, 2016 DIGITAL DIAGNOSTIC LEFT MAMMOGRAM WITH 3D TOMOSYNTHESIS AND CAD  COMPARISON: Screening mammogram dated 01/09/2016.  ACR Breast Density Category b: There are scattered areas  of fibroglandular density.  FINDINGS: Cc and MLO tomosynthesis was performed of the left breast. The initially questioned left breast asymmetry resolves on the additional imaging with findings compatible with overlapping tissue. There is no mammographic evidence of malignancy in the left breast.  Mammographic images were processed with CAD.  IMPRESSION: Initially questioned left breast asymmetry resolves on the additional imaging with findings compatible with overlapping tissue. There is no mammographic evidence of malignancy in the left breast.  RECOMMENDATION: Screening mammogram in one year.(Code:SM-B-01Y)  I have discussed the findings and recommendations with the patient. Results were also provided in writing at the conclusion of the visit. If applicable, a reminder letter will be sent to the patient regarding the next appointment.  BI-RADS CATEGORY 1: Negative.  Everlean Alstrom M.D. 01/17/2016 07:43  January 09, 2016 DIGITAL SCREENING BILATERAL MAMMOGRAM WITH CAD  COMPARISON: None  ACR Breast Density Category b: There are scattered areas of fibroglandular density.  FINDINGS: In the left breast, a possible asymmetry warrants further evaluation. In the right breast, no findings suspicious for malignancy. Images were processed with CAD.  IMPRESSION: Further evaluation is suggested for possible asymmetry in the left breast.  RECOMMENDATION: Diagnostic mammogram and possibly ultrasound of the left breast. (Code:FI-L-6M)  The patient will be contacted regarding the findings, and additional imaging will be scheduled.  BI-RADS CATEGORY 0: Incomplete. Need additional imaging evaluation and/or prior mammograms for comparison.  Curlene Dolphin M.D. 01/09/2016 08:53  August 06, 2014 CT ABDOMEN AND PELVIS WITH CONTRAST  TECHNIQUE: Multidetector CT imaging  of the abdomen and pelvis was performed using the standard protocol following bolus  administration of intravenous contrast. Sagittal and coronal MPR images reconstructed from axial data set.  CONTRAST: 193mL OMNIPAQUE IOHEXOL 300 MG/ML SOLN IV. Dilute oral contrast.  COMPARISON: None; correlation made with pelvic ultrasound of 08/06/2014  FINDINGS: Lung bases clear.  Liver, spleen, pancreas, kidneys, and adrenal glands normal.  Normal appendix.  Uterus appears mildly enlarged with an enhancing nodular focus in the LEFT lateral uterus 2.8 x 2.0 cm image 71, not seen on preceding ultrasound.  Low-attenuation focus more inferiorly in uterus 2.1 x 1.7 cm, question low-attenuation leiomyoma versus prominent endometrial canal identified on ultrasound.  Small amount of nonspecific cul de sac free fluid.  Unremarkable adnexae, bladder and ureters.  Stomach and bowel loops normal appearance.  Specifically no sigmoid diverticula or inflammatory changes to suggest diverticulitis or colitis.  No mass, adenopathy, free fluid or free air.  Bones unremarkable.  IMPRESSION: Small probable uterine leiomyoma with additional focus of low attenuation which could represent an additional leiomyoma or be related to mildly prominent endometrial complex identified on preceding ultrasound.  Small amount of nonspecific free pelvic fluid.  Otherwise negative exam.  Lavonia Dana M.D. 08/06/2014 12:47  Summary/Assessment: In the setting of iron deficiency anemia, likely secondary to long-standing menorrhagia, previously failed and intolerant to ferrous sulfate, she presents now following parenteral iron on October 4, for repeat laboratory studies and reevaluation.  Over the past several weeks, she complains of dizziness, lightheadedness, and near syncope with positional changes and strenuous activity.  In addition she complains of intermittent mild numbness on the dorsum of her feet bilaterally.  Although her appetite has been slightly diminished, her  weight remains unchanged.  Her more recent laboratory studies from September 19 at the [facility] revealed hemoglobin 8.2 hematocrit 25.5 MCV 63.3 MCH 20.1 RDW 16.8 WBC 8.7 with 71% neutrophils 21% lymphocytes 5% monocytes 3% eosinophils 1% basophil; platelets 378,000.  Serum iron <10, total iron binding capacity 442, and iron saturation 2%.  She has never had parenteral iron.  She has never been transfused.  She has no prior associated blood disorder.    There is no known history of any blood disorders in her immediate family. She had been on ferrous sulfate: 325 mg once daily.  Her intake has been sporadic due to constipation.   In the interim since her last visit, both her appetite and weight remain stable. She has no fever, shaking chills, sweats, or flulike symptoms.  She reports no rash or itching.  There is no pain or difficulty in swallowing. She denies headache or syncope.  She denies any visual changes or hearing deficit.  There is no rash or itching.  She denies any unusual cough, sore throat, or orthopnea.    She has no chest or abdominal pain.  There is no epistaxis or hemoptysis.  She has no nausea, vomiting, or diarrhea.  With iron supplements, she is constipated and now moves her bowels incompletely once daily.  She normally has 3-4 bowel movements daily.  She denies heartburn or indigestion.  She has no melena or bright red blood per rectum. There is no urinary frequency, urgency, hematuria, or dysuria.  She denies swelling of her ankles.  There are no new areas of bone, joint, muscle pain.  She has no numbness or tingling in her fingers.   With clear evidence of iron deficiency anemia, on October 4, she received parenteral iron 510 mg without incident.  Her  other comorbid problems include  primary hypertension; uterine fibroids; irregular menstrual cycle with menorrhagia; and an elevated BMI. Beginning in her 19s she was on oral contraception due to an irregular menstrual cycle.  Most  recently she has been on the etonogestrel-ethinyl estradiol vaginal ring which she discontinued nearly 5 months ago.  Her menstrual periods are irregular, possibly every 1-3 months.  Her periods can last 5-30 days on occasion.  In the past she has had both ultrasound and CT imaging.  She is followed by gynecology. Intermittently   Recommendation/Plan: The results of her laboratory studies from today were not available at the time of discharge.  She is no longer on oral iron supplementation.  She will be called with those results.  Should her hemoglobin and serum ferritin continued to be low, a second treatment will be scheduled.  Her first treatment was unremarkable without any adverse reactions or complaints.  Toniann was advised to continue vitamin B complex daily as an adjunct to parenteral iron.  Once again, it was highly recommended that she continue close gynecologic follow-up as previously arranged.  She is scheduled now for her second dose of parenteral iron on October 22.  Barring any unforeseen complications, her next scheduled doctor visit with laboratory studies is on November 4.   The total time spent discussing the results of her laboratory studies from today and recommendations was 20 minutes. At least 50% of that time was spent in discussion, reviewing outside records, laboratory evaluation, counseling, and answering questions.  This note was dictated using voice activated technology/software.  Unfortunately, typographical errors are not uncommon, and transcription is subject to mistakes and regrettably misinterpretation.  If necessary, clarification of the above information can be discussed with me at any time.  FOLLOW UP: AS DIRECTED   cc:         Marda Stalker, PA-C  Henreitta Leber, MD  Hematology/Oncology [facility] [address] [place], [place] [address] Office: [phone] SAYT: [phone]

## 2018-10-05 NOTE — Telephone Encounter (Signed)
Patient called on 10/16 regarding her appt on 10/17.  Due to her work schedule, she asked to change her appt time.  Scheduled her for 8:00 lab and 8:30 am with Dr. Audelia Hives.

## 2018-10-06 ENCOUNTER — Telehealth: Payer: Self-pay | Admitting: Hematology and Oncology

## 2018-10-06 ENCOUNTER — Inpatient Hospital Stay (HOSPITAL_BASED_OUTPATIENT_CLINIC_OR_DEPARTMENT_OTHER): Payer: 59 | Admitting: Hematology and Oncology

## 2018-10-06 ENCOUNTER — Inpatient Hospital Stay: Payer: 59

## 2018-10-06 ENCOUNTER — Inpatient Hospital Stay: Payer: 59 | Admitting: Hematology and Oncology

## 2018-10-06 VITALS — BP 128/86 | HR 65 | Temp 98.5°F | Resp 17 | Ht 66.0 in | Wt 279.4 lb

## 2018-10-06 DIAGNOSIS — I10 Essential (primary) hypertension: Secondary | ICD-10-CM

## 2018-10-06 DIAGNOSIS — D500 Iron deficiency anemia secondary to blood loss (chronic): Secondary | ICD-10-CM

## 2018-10-06 DIAGNOSIS — Z803 Family history of malignant neoplasm of breast: Secondary | ICD-10-CM | POA: Diagnosis not present

## 2018-10-06 DIAGNOSIS — N920 Excessive and frequent menstruation with regular cycle: Secondary | ICD-10-CM | POA: Diagnosis not present

## 2018-10-06 DIAGNOSIS — Z79899 Other long term (current) drug therapy: Secondary | ICD-10-CM | POA: Diagnosis not present

## 2018-10-06 LAB — CBC (CANCER CENTER ONLY)
HCT: 34.4 % — ABNORMAL LOW (ref 36.0–46.0)
Hemoglobin: 9.7 g/dL — ABNORMAL LOW (ref 12.0–15.0)
MCH: 20.6 pg — AB (ref 26.0–34.0)
MCHC: 28.2 g/dL — AB (ref 30.0–36.0)
MCV: 73 fL — ABNORMAL LOW (ref 80.0–100.0)
PLATELETS: 323 10*3/uL (ref 150–400)
RBC: 4.71 MIL/uL (ref 3.87–5.11)
RDW: 21.3 % — AB (ref 11.5–15.5)
WBC: 10.3 10*3/uL (ref 4.0–10.5)
nRBC: 0 % (ref 0.0–0.2)

## 2018-10-06 LAB — FERRITIN: Ferritin: 129 ng/mL (ref 11–307)

## 2018-10-06 NOTE — Progress Notes (Signed)
[facility] Progress Note  Patient Name:  Kimberly Coleman  DOB: 1971/12/10   Date of Service: October 06, 2018  Referring Provider: Marda Stalker, PA-C [place], [place] [address]   Consulting Physician: Henreitta Leber, MD Hematology/Oncology  Reason for Visit: In the setting of iron deficiency anemia, secondary to long-standing menorrhagia, previously on ferrous sulfate, she presents now for the results of the current laboratory studies and reevaluation following an iron infusion on October 4.  Brief History: Kimberly Coleman is a 47 year old single, nulliparous African-American resident of [place] whose past medical history is significant for primary hypertension; uterine fibroids; irregular menstrual cycle with menorrhagia; and an elevated BMI. Her primary care provider is Marda Stalker, physician assistant.  She sees also Dr. Janyth Pupa, obstetrics and gynecologic surgery, on a regularly scheduled basis.  Beginning in her 92s she was on oral contraception due to an irregular menstrual cycle.  Most recently she has been on the etonogestrel-ethinyl estradiol vaginal ring which she discontinued nearly 6 months ago.    Her menses are irregular, possibly every 1-3 months.  Her periods can last 5-30 days on occasion.  In the past she has had both ultrasound and CT imaging.    She has no personal history of cancer.  Her mother had early stage breast cancer at the age of 58 years.  She is 47 years old at present.  Intermittently she had been on ferrous sulfate: 325 mg once daily.  Her intake has been sporadic.  The iron supplements were associated with mild to moderate constipation.   She is on no new medications.  Since her last visit she was started on vitamin B 50 complex as requested.  On October 4, she received her first parenteral iron infusion without incident.  She has had no menses since her last visit.  Over  the past several weeks, she had complained of dizziness, lightheadedness, and near syncope with positional changes and strenuous activity.  That has improved significantly. In addition she has had intermittent mild numbness on the dorsum of her feet bilaterally. Her laboratory studies from September 19 at the [facility] revealed hemoglobin 8.2 hematocrit 25.5 MCV 63.3 MCH 20.1 RDW 16.8 WBC 8.7 with 71% neutrophils 21% lymphocytes 5% monocytes 3% eosinophils 1% basophil; platelets 378,000. Serum iron <10, total iron binding capacity 442, and iron saturation 2%.  She had never previously had parenteral iron.  She has never been transfused. She has no prior associated blood disorder.  She is not a vegetarian.  Interval History: In the interim since her last visit, she has no new problems or complaints.  Her weight is stable.  Her appetite appears to have improved.  Her overall energy level is essentially unchanged. She has no numbness or tingling in her fingers. She denies headache, or syncope.   She has positional dizziness without syncope or near syncopal episodes.  She denies any visual changes or hearing deficit.  There is no rash or itching. She denies any unusual cough, sore throat, or orthopnea.  She denies hemoptysis or hematochezia.  She has no nausea, vomiting, or diarrhea.  Her constipation has improved since discontinuing oral iron supplements. She normally has 3-4 bowel movements daily.  She denies heartburn or indigestion.  She has no melena or bright red blood per rectum.  There is no urinary frequency, urgency, hematuria, or dysuria.  She denies swelling of her ankles.  There are no new focal areas of bone, joint,  muscle pain.  She reports no numbness or tingling in the fingers or toes.  Past Medical History Reviewed        Family History Reviewed       Social History Reviewed  Allergies  Allergen Reactions  . Other Anaphylaxis, Shortness Of Breath and Itching    CASHEWS  . Percocet  [Oxycodone-Acetaminophen] Nausea And Vomiting   Current Outpatient Medications on File Prior to Visit  Medication Sig  . amLODipine (NORVASC) 5 MG tablet Take 5 mg by mouth daily.  Marland Kitchen b complex vitamins capsule Take 1 capsule by mouth daily.    Marland Kitchen etonogestrel-ethinyl estradiol (NUVARING) 0.12-0.015 MG/24HR vaginal ring Place 1 each vaginally every 28 (twenty-eight) days. Insert vaginally and leave in place for 3 consecutive weeks, then remove for 1 week.  . hydrochlorothiazide (HYDRODIURIL) 25 MG tablet Take 25 mg by mouth every morning.   No current facility-administered medications on file prior to visit.     Review of Systems: Constitutional: No fever, sweats, or shaking chills.  Mild appetite deficit over the past 12 months; no weight deficit; elevated BMI; energy level diminished. Skin: No rash, scaling, sores, lumps, or jaundice. HEENT: No visual changes or hearing deficit. Pulmonary: No unusual cough, sore throat, or orthopnea. Breasts: No complaints. Cardiovascular: No coronary artery disease, angina, or myocardial infarction.  No cardiac dysrhythmia or dyslipidemia; primary hypertension. Gastrointestinal: No indigestion, dysphagia, abdominal pain, diarrhea; mild constipation on oral iron.  Genitourinary: No urinary frequency, urgency, hematuria, or dysuria. Musculoskeletal: No arthralgias or myalgias; no joint swelling, pain, or instability. Hematologic: No history of sickle cell disease or trait.  No easy bruisability. Endocrine: Intolerant to cold; no intolerance to heat; no thyroid disease or diabetes mellitus. Vascular: No peripheral arterial or venous thromboembolic disease. Psychological: No anxiety, depression, or mood changes; no mental health illnesses. Neurological: Positional dizziness and lightheadedness.  No near-syncope or syncopal episodes. Intermittent nonpainful numbness of the dorsum of her feet over the past 2 weeks now improved; no numbness or tingling in the  fingers or hands.  Physical Examination: Vital Signs: Body surface area is 2.43 meters squared.  Vitals:   10/06/18 0824  BP: 128/86  Pulse: 65  Resp: 17  Temp: 98.5 F (36.9 C)  SpO2: 100%    Filed Weights   10/06/18 0824  Weight: 279 lb 6.4 oz (126.7 kg)  Body mass index is 45.1 kg/m. Constitutional:  Kimberly Coleman is fully nourished and developed albeit overweight African-American.  She looks age appropriate.  She is friendly and cooperative without respiratory compromise at rest. Skin: No rashes, scaling, dryness, jaundice, or itching. HEENT: Head is normocephalic and atraumatic.  Pupils are equal round and reactive to light and accommodation.  Sclerae are anicteric.  Conjunctivae are pale.  No sinus tenderness nor oropharyngeal lesions.  Lips without cracking or peeling; tongue without mass, inflammation, or nodularity.  Mucous membranes are moist. Neck: Full, supple and symmetric.  No jugular venous distention or thyromegaly.  Trachea is midline. Lymphatics: No cervical or supraclavicular lymphadenopathy.  No epitrochlear, axillary, or inguinal lymphadenopathy is appreciated. Respiratory/chest: Thorax is symmetrical.  Breath sounds are clear to auscultation and percussion.  Normal excursion and respiratory effort. Back: Symmetric without deformity or tenderness. Cardiovascular: Heart rate and rhythm are regular without murmurs or gallops. Gastrointestinal: Abdomen is obese, soft, nontender; no organomegaly.  Bowel sounds are normoactive.  No masses are appreciated. Rectal examination: Not performed. Extremities: In the lower extremities, there is no asymmetric swelling, erythema, tenderness, or cord formation.  No clubbing, cyanosis, nor edema. Hematologic: No petechiae, hematomas, or ecchymoses. Psychological She is oriented to person, place, and time; normal affect, memory, and cognition. Neurological: There are no gross neurologic deficits.  Laboratory Results: October 06, 2018  Ref Range & Units 08:09 2wk ago 60yr ago 53yr ago  WBC Count 4.0 - 10.5 K/uL 10.3  9.5 R 20.9High   9.8   RBC 3.87 - 5.11 MIL/uL 4.71  4.20 R 4.85  3.37Low    Hemoglobin 12.0 - 15.0 g/dL 9.7Low   8.3Low  R 10.1Low   7.6Low    Comment: Reticulocyte Hemoglobin testing  may be clinically indicated,  consider ordering this additional  test [id number]   HCT 36.0 - 46.0 % 34.4Low   27.9Low  R 32.7Low   26.6Low    MCV 80.0 - 100.0 fL 73.0Low   66.3Low  R 67.4Low  R 78.9 R  MCH 26.0 - 34.0 pg 20.6Low   19.8Low  R 20.8Low   22.6Low    MCHC 30.0 - 36.0 g/dL 28.2Low   29.9Low  R 30.9  28.6Low    RDW 11.5 - 15.5 % 21.3High   17.6High  R 16.9High   15.3   Platelet Count 150 - 400 K/uL 323  385 R 353  431High    nRBC 0.0 - 0.2 % 0.0      Ferritin 129  September 19, 2018 Reticulocyte count 1.1% Hemoglobin electrophoresis: No evidence of sickle trait or sickle disease. Ferritin 4  Diagnostic/Imaging Studies: January 17, 2016 DIGITAL DIAGNOSTIC LEFT MAMMOGRAM WITH 3D TOMOSYNTHESIS AND CAD  COMPARISON: Screening mammogram dated 01/09/2016.  ACR Breast Density Category b: There are scattered areas of fibroglandular density.  FINDINGS: Cc and MLO tomosynthesis was performed of the left breast. The initially questioned left breast asymmetry resolves on the additional imaging with findings compatible with overlapping tissue. There is no mammographic evidence of malignancy in the left breast.  Mammographic images were processed with CAD.  IMPRESSION: Initially questioned left breast asymmetry resolves on the additional imaging with findings compatible with overlapping tissue. There is no mammographic evidence of malignancy in the left breast.  RECOMMENDATION: Screening mammogram in one year.(Code:SM-B-01Y)  I have discussed the findings and recommendations with the patient. Results were also provided in writing at the conclusion of the visit. If applicable, a reminder letter will  be sent to the patient regarding the next appointment.  BI-RADS CATEGORY 1: Negative.  Everlean Alstrom M.D. 01/17/2016 07:43  January 09, 2016 DIGITAL SCREENING BILATERAL MAMMOGRAM WITH CAD  COMPARISON: None  ACR Breast Density Category b: There are scattered areas of fibroglandular density.  FINDINGS: In the left breast, a possible asymmetry warrants further evaluation. In the right breast, no findings suspicious for malignancy. Images were processed with CAD.  IMPRESSION: Further evaluation is suggested for possible asymmetry in the left breast.  RECOMMENDATION: Diagnostic mammogram and possibly ultrasound of the left breast. (Code:FI-L-66M)  The patient will be contacted regarding the findings, and additional imaging will be scheduled.  BI-RADS CATEGORY 0: Incomplete. Need additional imaging evaluation and/or prior mammograms for comparison.  Curlene Dolphin M.D. 01/09/2016 08:53  August 06, 2014 EXAM: CT ABDOMEN AND PELVIS WITH CONTRAST  TECHNIQUE: Multidetector CT imaging of the abdomen and pelvis was performed using the standard protocol following bolus administration of intravenous contrast. Sagittal and coronal MPR images reconstructed from axial data set.  CONTRAST: 121mL OMNIPAQUE IOHEXOL 300 MG/ML SOLN IV. Dilute oral contrast.  COMPARISON: None; correlation made with pelvic ultrasound of 08/06/2014  FINDINGS: Lung bases clear.  Liver, spleen, pancreas, kidneys, and adrenal glands normal.  Normal appendix.  Uterus appears mildly enlarged with an enhancing nodular focus in the LEFT lateral uterus 2.8 x 2.0 cm image 71, not seen on preceding ultrasound.  Low-attenuation focus more inferiorly in uterus 2.1 x 1.7 cm, question low-attenuation leiomyoma versus prominent endometrial canal identified on ultrasound.  Small amount of nonspecific cul de sac free fluid.  Unremarkable adnexae, bladder and  ureters.  Stomach and bowel loops normal appearance.  Specifically no sigmoid diverticula or inflammatory changes to suggest diverticulitis or colitis.  No mass, adenopathy, free fluid or free air.  Bones unremarkable.  IMPRESSION: Small probable uterine leiomyoma with additional focus of low attenuation which could represent an additional leiomyoma or be related to mildly prominent endometrial complex identified on preceding ultrasound.  Small amount of nonspecific free pelvic fluid.  Otherwise negative exam.  Lavonia Dana M.D. 08/06/2014 12:47  Summary/Assessment: In the setting of iron deficiency anemia, secondary to long-standing menorrhagia, previously on ferrous sulfate, she presents now for the results of her laboratory studies and reevaluation following an iron infusion on October 4.  Her menses are irregular, possibly every 1-3 months.  Her periods can last 5-30 days on occasion.  In the past she has had both ultrasound and CT imaging.    She has no personal history of cancer.  Her mother had early stage breast cancer at the age of 60 years.  She is 47 years old at present.  Intermittently she has been on ferrous sulfate: 325 mg once daily.  Her intake has been sporadic.  The iron supplements are associated with mild to moderate constipation.   She is on no new medications.  Since her last visit she was started on vitamin B 50 complex as requested.  On October 4, she received her first parenteral iron infusion without incident.  Over the past several weeks, she complained of dizziness, lightheadedness, and near syncope with positional changes and strenuous activity along with intermittent mild numbness on the dorsum of her feet bilaterally.  Although her appetite has been slightly diminished, her weight remains unchanged.  Her energy level is low.  Her laboratory studies from September 19 at the [facility] revealed hemoglobin 8.2 hematocrit 25.5 MCV 63.3 MCH 20.1  RDW 16.8 WBC 8.7 with 71% neutrophils 21% lymphocytes 5% monocytes 3% eosinophils 1% basophil; platelets 378,000.  Serum iron <10, total iron binding capacity 442, and iron saturation 2%.  She had never had parenteral iron.  She has never been transfused.  In the interim since her last visit, she has no new problems or complaints.  Her weight is stable.  Her appetite appears to have improved.  Her overall energy level is essentially unchanged. She has no numbness or tingling in her fingers. She denies headache, or syncope.   She has positional dizziness without syncope or near syncopal episodes.  She denies any visual changes or hearing deficit.  There is no rash or itching. She denies any unusual cough, sore throat, or orthopnea.  She denies hemoptysis or hematochezia.  She has no nausea, vomiting, or diarrhea.  Her constipation has improved since discontinuing oral iron supplements. She normally has 3-4 bowel movements daily.  She denies heartburn or indigestion.  She has no melena or bright red blood per rectum.  There is no urinary frequency, urgency, hematuria, or dysuria.  She denies swelling of her ankles. There are no new focal areas of bone, joint, muscle pain. She  reports no numbness or tingling in the fingers or toes.  Her other comorbid problems include  primary hypertension; uterine fibroids; irregular menstrual cycle with menorrhagia; and an elevated BMI. Beginning in her 67s she was on oral contraception due to an irregular menstrual cycle.  Most recently she has been on the etonogestrel-ethinyl estradiol vaginal ring which she discontinued nearly 5 months ago.  Her menstrual periods are irregular, possibly every 1-3 months.  Her periods can last 5-30 days on occasion.  In the past she has had both ultrasound and CT imaging.  She is followed by gynecology. Intermittently she has been on ferrous sulfate: 325 mg once daily.  Her intake has been sporadic. The iron supplements are associated with  mild to moderate constipation.    Recommendation/Plan: We discussed in detail the results of her laboratory studies past and present.  Those results are detailed above.  Her hemoglobin today has increased from 8.3 to 9.7 g/dL.  The serum ferritin is 129.  Both her platelet and white blood cell count are normal.  She has tolerated parenteral iron without difficulty on October 4.  She is scheduled now for a repeat infusion on October 25.  She was advised to continue vitamin B complex as previously directed.  A follow-up appointment with lab work has been scheduled on November 14.  She was advised to call us in the interim should any new or untoward problems arise.  The total time spent discussing today's laboratory studies, role and rationale for continued parenteral iron and recommendations was 25 minutes. At least 50% of that time was spent in discussion, reviewing outside records, laboratory evaluation, counseling, and answering questions.  This note was dictated using voice activated technology/software.  Unfortunately, typographical errors are not uncommon, and transcription is subject to mistakes and regrettably misinterpretation.  If necessary, clarification of the above information can be discussed with me at any time.  FOLLOW UP: AS DIRECTED   cc:         Marda Stalker, PA-C               Janyth Pupa, DO   Henreitta Leber, MD  [facility] [address] [place], [place] [address] Office: [phone] KDXI: [phone]

## 2018-10-06 NOTE — Patient Instructions (Signed)
The results of your laboratory studies from today suggest improvement in your anemia.  Your hemoglobin is 9.7.  Prior to treatment it was 8.3 g/dL.  Both your platelet and white blood cell count were normal.  An iron infusion has been requested on October 25.  It is great that she tolerated that treatment well without any ill effects.  Keep up the good work.  A follow-up appointment with lab work before your visit on November 14 has been requested.  Please do not hesitate to call should any new or untoward problems arise.  Thank you!  Ladona Ridgel, MD Hematology/Oncology

## 2018-10-06 NOTE — Telephone Encounter (Signed)
Gave pt avs and calendar  °

## 2018-10-13 ENCOUNTER — Inpatient Hospital Stay: Payer: 59

## 2018-10-13 VITALS — BP 136/89 | HR 63 | Temp 98.5°F | Resp 17

## 2018-10-13 DIAGNOSIS — D500 Iron deficiency anemia secondary to blood loss (chronic): Secondary | ICD-10-CM

## 2018-10-13 DIAGNOSIS — Z803 Family history of malignant neoplasm of breast: Secondary | ICD-10-CM | POA: Diagnosis not present

## 2018-10-13 DIAGNOSIS — N920 Excessive and frequent menstruation with regular cycle: Secondary | ICD-10-CM | POA: Diagnosis not present

## 2018-10-13 DIAGNOSIS — Z79899 Other long term (current) drug therapy: Secondary | ICD-10-CM | POA: Diagnosis not present

## 2018-10-13 DIAGNOSIS — N938 Other specified abnormal uterine and vaginal bleeding: Secondary | ICD-10-CM

## 2018-10-13 DIAGNOSIS — I10 Essential (primary) hypertension: Secondary | ICD-10-CM | POA: Diagnosis not present

## 2018-10-13 MED ORDER — SODIUM CHLORIDE 0.9 % IV SOLN
Freq: Once | INTRAVENOUS | Status: AC
  Administered 2018-10-13: 09:00:00 via INTRAVENOUS
  Filled 2018-10-13: qty 250

## 2018-10-13 MED ORDER — SODIUM CHLORIDE 0.9 % IV SOLN
510.0000 mg | Freq: Once | INTRAVENOUS | Status: AC
  Administered 2018-10-13: 510 mg via INTRAVENOUS
  Filled 2018-10-13: qty 17

## 2018-10-13 NOTE — Patient Instructions (Signed)

## 2018-10-13 NOTE — Progress Notes (Addendum)
Pt declined to say for 30 min post Feraheme observation period. Vitals taken and stable, and pt denied any CP, SOB, dizziness, or other symptoms. IV removed intact and pt ambulated out without incident.  Vitals:   10/13/18 0843 10/13/18 0934  BP: 138/84 136/89  Pulse: 76 63  Resp: 16 17  Temp: 98.5 F (36.9 C) 98.5 F (36.9 C)  TempSrc: Oral Oral  SpO2: 100% 100%

## 2018-10-14 ENCOUNTER — Ambulatory Visit: Payer: Self-pay

## 2018-10-21 DIAGNOSIS — Z6841 Body Mass Index (BMI) 40.0 and over, adult: Secondary | ICD-10-CM | POA: Diagnosis not present

## 2018-10-21 DIAGNOSIS — Z01411 Encounter for gynecological examination (general) (routine) with abnormal findings: Secondary | ICD-10-CM | POA: Diagnosis not present

## 2018-10-21 DIAGNOSIS — D500 Iron deficiency anemia secondary to blood loss (chronic): Secondary | ICD-10-CM | POA: Diagnosis not present

## 2018-10-21 DIAGNOSIS — I10 Essential (primary) hypertension: Secondary | ICD-10-CM | POA: Diagnosis not present

## 2018-10-21 DIAGNOSIS — N939 Abnormal uterine and vaginal bleeding, unspecified: Secondary | ICD-10-CM | POA: Diagnosis not present

## 2018-10-26 DIAGNOSIS — L821 Other seborrheic keratosis: Secondary | ICD-10-CM | POA: Diagnosis not present

## 2018-11-03 ENCOUNTER — Other Ambulatory Visit: Payer: Self-pay

## 2018-11-03 ENCOUNTER — Ambulatory Visit: Payer: Self-pay | Admitting: Hematology and Oncology

## 2018-11-10 ENCOUNTER — Other Ambulatory Visit: Payer: Self-pay | Admitting: Hematology and Oncology

## 2018-11-10 ENCOUNTER — Encounter: Payer: Self-pay | Admitting: Hematology and Oncology

## 2018-11-10 ENCOUNTER — Telehealth: Payer: Self-pay

## 2018-11-10 ENCOUNTER — Inpatient Hospital Stay: Payer: 59 | Attending: Hematology and Oncology

## 2018-11-10 ENCOUNTER — Inpatient Hospital Stay: Payer: 59 | Admitting: Hematology and Oncology

## 2018-11-10 VITALS — BP 117/83 | HR 74 | Temp 97.7°F | Resp 18 | Ht 66.0 in | Wt 280.6 lb

## 2018-11-10 DIAGNOSIS — I10 Essential (primary) hypertension: Secondary | ICD-10-CM

## 2018-11-10 DIAGNOSIS — D500 Iron deficiency anemia secondary to blood loss (chronic): Secondary | ICD-10-CM

## 2018-11-10 DIAGNOSIS — N920 Excessive and frequent menstruation with regular cycle: Secondary | ICD-10-CM

## 2018-11-10 DIAGNOSIS — Z79899 Other long term (current) drug therapy: Secondary | ICD-10-CM | POA: Diagnosis not present

## 2018-11-10 LAB — CBC WITH DIFFERENTIAL (CANCER CENTER ONLY)
ABS IMMATURE GRANULOCYTES: 0.02 10*3/uL (ref 0.00–0.07)
BASOS PCT: 1 %
Basophils Absolute: 0 10*3/uL (ref 0.0–0.1)
Eosinophils Absolute: 0.2 10*3/uL (ref 0.0–0.5)
Eosinophils Relative: 2 %
HEMATOCRIT: 34.8 % — AB (ref 36.0–46.0)
HEMOGLOBIN: 10 g/dL — AB (ref 12.0–15.0)
IMMATURE GRANULOCYTES: 0 %
LYMPHS ABS: 2.4 10*3/uL (ref 0.7–4.0)
Lymphocytes Relative: 30 %
MCH: 22.7 pg — AB (ref 26.0–34.0)
MCHC: 28.7 g/dL — ABNORMAL LOW (ref 30.0–36.0)
MCV: 79.1 fL — AB (ref 80.0–100.0)
MONO ABS: 0.5 10*3/uL (ref 0.1–1.0)
MONOS PCT: 7 %
NEUTROS ABS: 4.7 10*3/uL (ref 1.7–7.7)
NEUTROS PCT: 60 %
Platelet Count: 308 10*3/uL (ref 150–400)
RBC: 4.4 MIL/uL (ref 3.87–5.11)
RDW: 21.9 % — AB (ref 11.5–15.5)
WBC Count: 7.8 10*3/uL (ref 4.0–10.5)
nRBC: 0 % (ref 0.0–0.2)

## 2018-11-10 NOTE — Patient Instructions (Signed)
We discussed the results of your laboratory studies from today.  They show steady improvement.  Copies were given for your review.  A repeat iron infusion is now scheduled for December 12.  Barring any unforeseen complications, your next scheduled doctor visit to discuss your lab work is on December 29, 2018 with Dr. Benay Spice.  Please do not hesitate to call in the interim should any new or untoward problems arise.  Thank you for being you! Happy happy Thanksgiving! Blessed holiday to you and yours Kimberly Blalock, MD Hematology/Oncology

## 2018-11-10 NOTE — Telephone Encounter (Signed)
Printed avs and calender of upcoming appointment. Per 11/21 los 

## 2018-11-10 NOTE — Progress Notes (Signed)
Hematology/Oncology OutpatientProgress Note  Patient Name:  Kimberly Coleman  DOB: 1971-06-04   Date of Service: November 10, 2018  Referring Provider: Marda Stalker, PA-C [place], [place] [address]   Consulting Physician: Henreitta Leber, MD Hematology/Oncology  Reason for Visit: In the setting of iron deficiency anemia, secondary to long-standing menorrhagia, intolerant of ferrous sulfate, she presents now for the results of the current laboratory studies and reevaluation, following an iron infusion on October 4 and October 24.  Brief History: Kimberly Coleman is a 47 year old single, nulliparous African-American resident of [place] whose past medical history is significant for primary hypertension; uterine fibroids; irregular menstrual cycle with menorrhagia; and an elevated BMI. Her primary care provider is Marda Stalker, physician assistant.  She was also seeen by Dr. Janyth Pupa, obstetrics and gynecologic surgery.  Beginning in her 81s she was on oral contraception due to an irregular menstrual cycle.  Most recently she has been on the etonogestrel-ethinyl estradiol vaginal ring which she discontinued nearly 6 months ago.  She is currently not on any oral contraception or intrauterine device.  Her laboratory studies from September 19 at the [facility] revealed hemoglobin 8.2 hematocrit 25.5 MCV 63.3 MCH 20.1 RDW 16.8 WBC 8.7 with 71% neutrophils 21% lymphocytes 5% monocytes 3% eosinophils 1% basophil; platelets 378,000. Serum iron <10, total iron binding capacity 442, and iron saturation 2%.     Her menses are irregular, possibly every 1-3 months. Her periods can last 5-30 days on occasion.  In the past she has had both ultrasound and CT imaging.   She has no personal history of cancer.  Her mother had early stage breast cancer at the age of 4 years. She is 47 years old at present.  Intermittently she had been on ferrous sulfate: 325 mg once daily.  Her  intake has been sporadic.  The iron supplements were associated with moderate constipation.  She is on no new medications.  Since her last visit she was started on vitamin B 50 complex as requested.  On October 4, she received her first parenteral iron infusion without incident.  Her second dose of parenteral iron was given on October 24.  She tolerated that treatment well without difficulty.  Her most recent menses was on October 25.  As usual it was heavy, but not prolonged. Over the past several weeks, she had complained of dizziness, lightheadedness, and near syncope with positional changes and strenuous activity. That has improved significantly. Now her energy level is improved in addition. She has had intermittent mild numbness on the dorsum of her feet bilaterally. Prior to our initial evaluation, she had never previously had parenteral iron. She has never been transfused. She has no prior associated blood disorder. She is not a vegetarian.  It is with this background she presents now for the results of a repeat complete blood count as outlined above.  Interval History: In the interim since her last visit, she has no new problems or complaints.  Her weight is stable.  Her appetite appears to have improved.  Her overall energy level is improving. She has no numbness or tingling in her fingers. She denies headache, or syncope.  She has no dizziness, lightheadedness, syncope, or near syncopal episodes. She denies any visual changes or hearing deficit. There is no rash or itching. She denies any unusual cough, sore throat, or orthopnea. She denies hemoptysis or hematochezia.  She has no nausea, vomiting, or diarrhea.  Her constipation has improved since discontinuing oral iron  supplements. She normally has 3 bowel movements daily. She denies heartburn or indigestion.  She has no melena or bright red blood per rectum.  There is no urinary frequency, urgency, hematuria, or dysuria.  She denies swelling of her  ankles.  There are no new focal areas of bone, joint, muscle pain.  She reports no numbness or tingling in the fingers or toes.  Past Medical History Reviewed        Family History Reviewed       Social History Reviewed  Allergies  Allergen Reactions  . Other Anaphylaxis, Shortness Of Breath and Itching    CASHEWS  . Percocet [Oxycodone-Acetaminophen] Nausea And Vomiting   Current Outpatient Medications on File Prior to Visit  Medication Sig  . amLODipine (NORVASC) 5 MG tablet Take 5 mg by mouth daily.  Marland Kitchen b complex vitamins capsule Take 1 capsule by mouth daily.    . hydrochlorothiazide (HYDRODIURIL) 25 MG tablet Take 25 mg by mouth every morning.   No current facility-administered medications on file prior to visit.     Review of Systems: Constitutional: No fever, sweats, or shaking chills.    No appetite or weight deficit; elevated BMI; energy level improved. Skin: No rash, scaling, sores, lumps, or jaundice. HEENT: No visual changes or hearing deficit. Pulmonary: No unusual cough, sore throat, or orthopnea. Breasts: No complaints. Cardiovascular: No coronary artery disease, angina, or myocardial infarction.  No cardiac dysrhythmia or dyslipidemia; primary hypertension. Gastrointestinal: No indigestion, dysphagia, abdominal pain, diarrhea; mild constipation on oral iron.  Genitourinary: No urinary frequency, urgency, hematuria, or dysuria. Musculoskeletal: No arthralgias or myalgias; no joint swelling, pain, or instability. Hematologic: No history of sickle cell disease or trait.  No easy bruisability. Endocrine: Intolerant to cold; no intolerance to heat; no thyroid disease or diabetes mellitus. Vascular: No peripheral arterial or venous thromboembolic disease. Psychological: No anxiety, depression, or mood changes; no mental health illnesses. Neurological: Positional dizziness improved. Intermittent nonpainful numbness of the dorsum of her feet over the past 2 weeks now improved;  no numbness or tingling in the fingers or hands.  Physical Examination: Vital Signs: Body surface area is 2.43 meters squared.  Vitals:   11/10/18 1410  BP: 117/83  Pulse: 74  Resp: 18  Temp: 97.7 F (36.5 C)  SpO2: 100%    Filed Weights   11/10/18 1410  Weight: 280 lb 9.6 oz (127.3 kg)  Body mass index is 45.29 kg/m. Constitutional:  Kimberly Coleman is fully nourished and developed albeit overweight African-American.  She looks age appropriate.  She is friendly and cooperative without respiratory compromise at rest. Skin: No rashes, scaling, dryness, jaundice, or itching. HEENT: Head is normocephalic and atraumatic.  Pupils are equal round and reactive to light and accommodation.  Sclerae are anicteric.  Conjunctivae are pale.  No sinus tenderness nor oropharyngeal lesions.  Lips without cracking or peeling; tongue without mass, inflammation, or nodularity.  Mucous membranes are moist. Neck: Full, supple and symmetric.  No jugular venous distention or thyromegaly.  Trachea is midline. Lymphatics: No cervical or supraclavicular lymphadenopathy.  No epitrochlear, axillary, or inguinal lymphadenopathy is appreciated. Respiratory/chest: Thorax is symmetrical.  Breath sounds are clear to auscultation and percussion.  Normal excursion and respiratory effort. Back: Symmetric without deformity or tenderness. Cardiovascular: Heart rate and rhythm are regular without murmurs or gallops. Gastrointestinal: Abdomen is obese, soft, nontender; no organomegaly.  Bowel sounds are normoactive.  No masses are appreciated. Rectal examination: Not performed. Extremities: In the lower extremities, there is no  asymmetric swelling, erythema, tenderness, or cord formation.  No clubbing, cyanosis, nor edema. Hematologic: No petechiae, hematomas, or ecchymoses. Psychological She is oriented to person, place, and time; normal affect, memory, and cognition. Neurological: There are no gross neurologic  deficits.  Laboratory Results: November 10, 2018 13:37 (11/10/18) 56mo ago (10/06/18) 67mo ago (09/19/18) 48yr ago (08/06/14) 42yr ago (10/08/11)    WBC Count 4.0 - 10.5 K/uL 7.8  10.3  9.5 R 20.9High   9.8   RBC 3.87 - 5.11 MIL/uL 4.40  4.71  4.20 R 4.85  3.37Low    Hemoglobin 12.0 - 15.0 g/dL 10.0Low   9.7Low  CM 8.3Low  R 10.1Low   7.6Low    HCT 36.0 - 46.0 % 34.8Low   34.4Low   27.9Low  R 32.7Low   26.6Low    MCV 80.0 - 100.0 fL 79.1Low   73.0Low   66.3Low  R 67.4Low  R 78.9 R  MCH 26.0 - 34.0 pg 22.7Low   20.6Low   19.8Low  R 20.8Low   22.6Low    MCHC 30.0 - 36.0 g/dL 28.7Low   28.2Low   29.9Low  R 30.9  28.6Low    RDW 11.5 - 15.5 % 21.9High   21.3High   17.6High  R 16.9High   15.3   Platelet Count 150 - 400 K/uL 308  323  385 R 353  431High    nRBC 0.0 - 0.2 % 0.0  0.0 CM     Neutrophils Relative % % 60   71   70 R  Neutro Abs 1.7 - 7.7 K/uL 4.7   6.8High  R  6.9   Lymphocytes Relative % 30   21   24  R  Lymphs Abs 0.7 - 4.0 K/uL 2.4   2.0 R  2.3   Monocytes Relative % 7   6   5  R  Monocytes Absolute 0.1 - 1.0 K/uL 0.5   0.6 R  0.5   Eosinophils Relative % 2   1   1  R  Eosinophils Absolute 0.0 - 0.5 K/uL 0.2   0.1   0.1 R  Basophils Relative % 1   1   0 R  Basophils Absolute 0.0 - 0.1 K/uL 0.0   0.1 CM  0.0   Immature Granulocytes % 0       Abs Immature Granulocytes 0.00 - 0.07 K/uL 0.02        October 06, 2018 Ferritin 129  September 19, 2018 Reticulocyte count 1.1% Hemoglobin electrophoresis: No evidence of sickle trait or sickle disease. Ferritin 4  Diagnostic/Imaging Studies: January 17, 2016 DIGITAL DIAGNOSTIC LEFT MAMMOGRAM WITH 3D TOMOSYNTHESIS AND CAD  COMPARISON: Screening mammogram dated 01/09/2016.  ACR Breast Density Category b: There are scattered areas of fibroglandular density.  FINDINGS: Cc and MLO tomosynthesis was performed of the left breast. The initially questioned left breast asymmetry resolves on the additional imaging with findings  compatible with overlapping tissue. There is no mammographic evidence of malignancy in the left breast.  Mammographic images were processed with CAD.  IMPRESSION: Initially questioned left breast asymmetry resolves on the additional imaging with findings compatible with overlapping tissue. There is no mammographic evidence of malignancy in the left breast.  RECOMMENDATION: Screening mammogram in one year.(Code:SM-B-01Y)  I have discussed the findings and recommendations with the patient. Results were also provided in writing at the conclusion of the visit. If applicable, a reminder letter will be sent to the patient regarding the next appointment.  BI-RADS CATEGORY 1: Negative.  Everlean Alstrom M.D. 01/17/2016 07:43  January 09, 2016 DIGITAL SCREENING BILATERAL MAMMOGRAM WITH CAD  COMPARISON: None  ACR Breast Density Category b: There are scattered areas of fibroglandular density.  FINDINGS: In the left breast, a possible asymmetry warrants further evaluation. In the right breast, no findings suspicious for malignancy. Images were processed with CAD.  IMPRESSION: Further evaluation is suggested for possible asymmetry in the left breast.  RECOMMENDATION: Diagnostic mammogram and possibly ultrasound of the left breast. (Code:FI-L-47M)  The patient will be contacted regarding the findings, and additional imaging will be scheduled.  BI-RADS CATEGORY 0: Incomplete. Need additional imaging evaluation and/or prior mammograms for comparison.  Curlene Dolphin M.D. 01/09/2016 08:53  August 06, 2014 EXAM: CT ABDOMEN AND PELVIS WITH CONTRAST  TECHNIQUE: Multidetector CT imaging of the abdomen and pelvis was performed using the standard protocol following bolus administration of intravenous contrast. Sagittal and coronal MPR images reconstructed from axial data set.  CONTRAST: 122mL OMNIPAQUE IOHEXOL 300 MG/ML SOLN IV. Dilute  oral contrast.  COMPARISON: None; correlation made with pelvic ultrasound of 08/06/2014  FINDINGS: Lung bases clear.  Liver, spleen, pancreas, kidneys, and adrenal glands normal.  Normal appendix.  Uterus appears mildly enlarged with an enhancing nodular focus in the LEFT lateral uterus 2.8 x 2.0 cm image 71, not seen on preceding ultrasound.  Low-attenuation focus more inferiorly in uterus 2.1 x 1.7 cm, question low-attenuation leiomyoma versus prominent endometrial canal identified on ultrasound.  Small amount of nonspecific cul de sac free fluid.  Unremarkable adnexae, bladder and ureters.  Stomach and bowel loops normal appearance.  Specifically no sigmoid diverticula or inflammatory changes to suggest diverticulitis or colitis.  No mass, adenopathy, free fluid or free air.  Bones unremarkable.  IMPRESSION: Small probable uterine leiomyoma with additional focus of low attenuation which could represent an additional leiomyoma or be related to mildly prominent endometrial complex identified on preceding ultrasound.  Small amount of nonspecific free pelvic fluid.  Otherwise negative exam.  Lavonia Dana M.D. 08/06/2014 12:47  Summary/Assessment: In the setting of iron deficiency anemia, secondary to long-standing menorrhagia, intolerant of ferrous sulfate, she presents now for the results of the current laboratory studies and reevaluation, following an iron infusion on October 4 and October 24.  Her laboratory studies from September 19 at the [facility] revealed hemoglobin 8.2 hematocrit 25.5 MCV 63.3 MCH 20.1 RDW 16.8 WBC 8.7 with 71% neutrophils 21% lymphocytes 5% monocytes 3% eosinophils 1% basophil; platelets 378,000. Serum iron <10, total iron binding capacity 442, and iron saturation 2%.     Her menses are irregular, possibly every 1-3 months. Her periods can last 5-30 days on occasion.  In the past she has had both ultrasound and CT  imaging.   She has no personal history of cancer.  Her mother had early stage breast cancer at the age of 2 years. She is 47 years old at present.  Intermittently she had been on ferrous sulfate: 325 mg once daily. Her intake had been sporadic.  The iron supplements were associated with moderate constipation.  She is on no new medications.  Since her last visit she was started on vitamin B 50 complex as requested.  On October 4, she received her first parenteral iron infusion without incident.  Her second dose of parenteral iron was given on October 24.  She tolerated that treatment well without difficulty.  Her most recent menses was on October 25.  As usual it was heavy, but not prolonged. Over the past several weeks, she  had complained of dizziness, lightheadedness, and near syncope with positional changes and strenuous activity. That has improved significantly. Now her energy level is improved in addition. She has had intermittent mild numbness on the dorsum of her feet bilaterally. Prior to our initial evaluation, she had never previously had parenteral iron. She has never been transfused. She has no prior associated blood disorder. She is not a vegetarian.  It is with this background she presents now for the results of a repeat complete blood count as outlined above.  Interval History: In the interim since her last visit, she has no new problems or complaints.  Her weight is stable. Her appetite appears to have improved.  Her overall energy level is improving. She has no numbness or tingling in her fingers. She denies headache, or syncope.  She has no dizziness, lightheadedness, syncope, or near syncopal episodes. She denies any visual changes or hearing deficit. There is no rash or itching. She denies any unusual cough, sore throat, or orthopnea. She denies hemoptysis or hematochezia.  She has no nausea, vomiting, or diarrhea. Her constipation has improved since discontinuing oral iron supplements. She  normally has 3 bowel movements daily. She denies heartburn or indigestion.  She has no melena or bright red blood per rectum.  There is no urinary frequency, urgency, hematuria, or dysuria.  She denies swelling of her ankles. There are no new focal areas of bone, joint, muscle pain.  She reports no numbness or tingling in the fingers or toes.  Her other comorbid problems include primary hypertension; uterine fibroids; irregular menstrual cycle with menorrhagia; and an elevated BMI. Her primary care provider is Marda Stalker, physician assistant. Beginning in her 106s she was on oral contraception due to an irregular menstrual cycle.  Most recently she has been on the etonogestrel-ethinyl estradiol vaginal ring which she discontinued nearly 6 months ago.  She is currently not on oral contraception or an intrauterine device.  Recommendation/Plan: We discussed in detail the results of her laboratory studies past and present.  Those results are detailed above.  Her hemoglobin today has increased from 8.3 to 9.7 to 10.0 g/dL.  Both her platelet and white blood cell count are normal.  She has tolerated parenteral iron without difficulty on October 4 and October 24. She is scheduled now for a repeat infusion on December 12 at her request.  She was advised to continue vitamin B complex as previously directed.  A follow-up appointment with a new provider, since I am leaving the practice, including lab work has been scheduled on January 9.  She was advised to call us in the interim should any new or untoward problems arise.  The total time spent discussing today's laboratory studies, 6 rationale for continued parenteral iron and recommendations was 15 minutes. At least 50% of that time was spent in discussion, reviewing outside records, laboratory evaluation, counseling, and answering questions.  This note was dictated using voice activated technology/software.  Unfortunately, typographical errors are not  uncommon, and transcription is subject to mistakes and regrettably misinterpretation.  If necessary, clarification of the above information can be discussed with me at any time.  FOLLOW UP: AS DIRECTED   cc:         Marda Stalker, PA-C     Henreitta Leber, MD  Hematology/Oncology [facility] [address] [place], [place] [address] Office: [phone] YCXK: [phone]

## 2018-11-11 ENCOUNTER — Ambulatory Visit: Payer: Self-pay | Admitting: Hematology and Oncology

## 2018-11-11 ENCOUNTER — Other Ambulatory Visit: Payer: Self-pay

## 2018-11-14 ENCOUNTER — Telehealth: Payer: Self-pay | Admitting: Hematology and Oncology

## 2018-11-14 NOTE — Telephone Encounter (Signed)
Appointments already on schedule as requested per 11/21 los. Iron scheduled at [facility].

## 2018-11-16 MED FILL — AMLODIPINE BESYLATE 5 MG TA: 5 | 90 days supply | Qty: 90 | Fill #0

## 2018-11-16 MED FILL — HYDROCHLOROTHIAZIDE 25 MG T: 25 | 90 days supply | Qty: 90 | Fill #0

## 2018-11-25 ENCOUNTER — Other Ambulatory Visit: Payer: Self-pay | Admitting: Obstetrics & Gynecology

## 2018-11-25 DIAGNOSIS — Z1231 Encounter for screening mammogram for malignant neoplasm of breast: Secondary | ICD-10-CM

## 2018-11-30 ENCOUNTER — Telehealth: Payer: Self-pay | Admitting: Oncology

## 2018-11-30 NOTE — Telephone Encounter (Signed)
R/S appt due to GBS being out on PAL - spoke with patient and confirmed appointment.

## 2018-12-02 ENCOUNTER — Encounter (HOSPITAL_COMMUNITY): Payer: Self-pay

## 2018-12-02 ENCOUNTER — Ambulatory Visit (HOSPITAL_COMMUNITY)
Admission: RE | Admit: 2018-12-02 | Discharge: 2018-12-02 | Disposition: A | Discharge status: Home / Self Care | Payer: 59 | Source: Ambulatory Visit | Attending: Oncology | Admitting: Oncology

## 2018-12-02 MED ORDER — SODIUM CHLORIDE 0.9 % IV SOLN: INTRAVENOUS | Status: DC | PRN

## 2018-12-02 MED ORDER — SODIUM CHLORIDE 0.9 % IV SOLN
510.0000 mg | Freq: Once | INTRAVENOUS | Status: DC
  Filled 2018-12-02: qty 17

## 2018-12-02 NOTE — Progress Notes (Signed)
Patient left AMA, per patient she has to go to work and she will reschedule appointment. Alert, oriented and ambulatory.

## 2018-12-05 ENCOUNTER — Telehealth: Payer: Self-pay | Admitting: Nurse Practitioner

## 2018-12-05 NOTE — Telephone Encounter (Signed)
Called regarding 12/24 per nurse schedule as soon as possilble

## 2018-12-13 ENCOUNTER — Inpatient Hospital Stay: Payer: 59 | Attending: Hematology and Oncology

## 2018-12-13 VITALS — BP 119/81 | HR 56 | Temp 97.8°F | Resp 16 | Ht 66.0 in

## 2018-12-13 DIAGNOSIS — N920 Excessive and frequent menstruation with regular cycle: Secondary | ICD-10-CM | POA: Diagnosis not present

## 2018-12-13 DIAGNOSIS — D500 Iron deficiency anemia secondary to blood loss (chronic): Secondary | ICD-10-CM | POA: Insufficient documentation

## 2018-12-13 DIAGNOSIS — N938 Other specified abnormal uterine and vaginal bleeding: Secondary | ICD-10-CM

## 2018-12-13 MED ORDER — SODIUM CHLORIDE 0.9 % IV SOLN
510.0000 mg | Freq: Once | INTRAVENOUS | Status: AC
  Administered 2018-12-13: 510 mg via INTRAVENOUS
  Filled 2018-12-13: qty 17

## 2018-12-13 MED ORDER — SODIUM CHLORIDE 0.9 % IV SOLN
Freq: Once | INTRAVENOUS | Status: AC
  Administered 2018-12-13: 09:00:00 via INTRAVENOUS
  Filled 2018-12-13: qty 250

## 2018-12-13 NOTE — Patient Instructions (Signed)

## 2018-12-29 ENCOUNTER — Ambulatory Visit: Payer: Self-pay | Admitting: Oncology

## 2018-12-29 ENCOUNTER — Other Ambulatory Visit: Payer: Self-pay

## 2019-01-05 ENCOUNTER — Other Ambulatory Visit: Payer: Self-pay | Admitting: Obstetrics and Gynecology

## 2019-01-05 ENCOUNTER — Ambulatory Visit
Admission: RE | Admit: 2019-01-05 | Discharge: 2019-01-05 | Disposition: A | Discharge status: Home / Self Care | Payer: 59 | Source: Ambulatory Visit | Attending: Obstetrics & Gynecology | Admitting: Obstetrics & Gynecology

## 2019-01-05 DIAGNOSIS — N898 Other specified noninflammatory disorders of vagina: Secondary | ICD-10-CM | POA: Diagnosis not present

## 2019-01-05 DIAGNOSIS — Z1231 Encounter for screening mammogram for malignant neoplasm of breast: Secondary | ICD-10-CM

## 2019-01-05 DIAGNOSIS — Z3202 Encounter for pregnancy test, result negative: Secondary | ICD-10-CM | POA: Diagnosis not present

## 2019-01-05 DIAGNOSIS — N939 Abnormal uterine and vaginal bleeding, unspecified: Secondary | ICD-10-CM | POA: Diagnosis not present

## 2019-01-05 DIAGNOSIS — D219 Benign neoplasm of connective and other soft tissue, unspecified: Secondary | ICD-10-CM | POA: Diagnosis not present

## 2019-01-05 DIAGNOSIS — N920 Excessive and frequent menstruation with regular cycle: Secondary | ICD-10-CM | POA: Diagnosis not present

## 2019-01-05 MED FILL — TRANEXAMIC ACID 650 MG TAB: 650 | 5 days supply | Qty: 30 | Fill #0

## 2019-01-06 ENCOUNTER — Inpatient Hospital Stay: Payer: 59 | Admitting: Nurse Practitioner

## 2019-01-06 ENCOUNTER — Telehealth: Payer: Self-pay

## 2019-01-06 ENCOUNTER — Inpatient Hospital Stay: Payer: 59 | Attending: Hematology and Oncology

## 2019-01-06 ENCOUNTER — Encounter: Payer: Self-pay | Admitting: Nurse Practitioner

## 2019-01-06 VITALS — BP 139/78 | HR 55 | Temp 97.7°F | Resp 18 | Ht 66.0 in | Wt 287.5 lb

## 2019-01-06 DIAGNOSIS — Z803 Family history of malignant neoplasm of breast: Secondary | ICD-10-CM

## 2019-01-06 DIAGNOSIS — I10 Essential (primary) hypertension: Secondary | ICD-10-CM | POA: Diagnosis not present

## 2019-01-06 DIAGNOSIS — D500 Iron deficiency anemia secondary to blood loss (chronic): Secondary | ICD-10-CM

## 2019-01-06 DIAGNOSIS — Z800 Family history of malignant neoplasm of digestive organs: Secondary | ICD-10-CM

## 2019-01-06 DIAGNOSIS — N920 Excessive and frequent menstruation with regular cycle: Secondary | ICD-10-CM | POA: Insufficient documentation

## 2019-01-06 DIAGNOSIS — R200 Anesthesia of skin: Secondary | ICD-10-CM | POA: Insufficient documentation

## 2019-01-06 DIAGNOSIS — D509 Iron deficiency anemia, unspecified: Secondary | ICD-10-CM

## 2019-01-06 LAB — CBC WITH DIFFERENTIAL (CANCER CENTER ONLY)
ABS IMMATURE GRANULOCYTES: 0.02 10*3/uL (ref 0.00–0.07)
Basophils Absolute: 0 10*3/uL (ref 0.0–0.1)
Basophils Relative: 0 %
Eosinophils Absolute: 0.1 10*3/uL (ref 0.0–0.5)
Eosinophils Relative: 2 %
HCT: 40 % (ref 36.0–46.0)
Hemoglobin: 12.3 g/dL (ref 12.0–15.0)
Immature Granulocytes: 0 %
Lymphocytes Relative: 21 %
Lymphs Abs: 1.8 10*3/uL (ref 0.7–4.0)
MCH: 24.8 pg — ABNORMAL LOW (ref 26.0–34.0)
MCHC: 30.8 g/dL (ref 30.0–36.0)
MCV: 80.6 fL (ref 80.0–100.0)
MONOS PCT: 5 %
Monocytes Absolute: 0.4 10*3/uL (ref 0.1–1.0)
NEUTROS ABS: 6.5 10*3/uL (ref 1.7–7.7)
NEUTROS PCT: 72 %
Platelet Count: 304 10*3/uL (ref 150–400)
RBC: 4.96 MIL/uL (ref 3.87–5.11)
RDW: 16.4 % — ABNORMAL HIGH (ref 11.5–15.5)
WBC Count: 8.9 10*3/uL (ref 4.0–10.5)
nRBC: 0 % (ref 0.0–0.2)

## 2019-01-06 LAB — FERRITIN: Ferritin: 129 ng/mL (ref 11–307)

## 2019-01-06 NOTE — Telephone Encounter (Signed)
Printed avs and calender of upcoming appointment. Per 11/7 los 

## 2019-01-06 NOTE — Progress Notes (Addendum)
[facility] OFFICE PROGRESS NOTE   Diagnosis: Iron deficiency anemia  INTERVAL HISTORY:   Kimberly Coleman is a 48 year old woman previously followed by Dr. Audelia Hives for iron deficiency anemia secondary to longstanding menorrhagia.  Labs at time of initial consultation with Dr. Audelia Hives, 09/19/2018, showed a hemoglobin of 8.3, MCV 66.3, ferritin 4.  She received Feraheme 510 mg on 09/23/2018, 10/13/2018 and 12/13/2018.  She reports tolerating the IV iron well.  No signs of a reaction.  She reports a longstanding history of irregular and prolonged menstrual cycles.  At one point she had bleeding for 52 days straight.  The bleeding is excessive at times.  She reports seeing a gynecologist yesterday for evaluation of menorrhagia.  She was prescribed a new medication to begin with the next menstrual cycle.  Last menstrual cycle was 2 months ago.  She denies any other bleeding.  Specifically no rectal bleeding or hematuria.  She has never had a colonoscopy.  She denies abdominal pain.  No nausea.  No fevers or sweats.  She has a good appetite.  No weight loss.  She has intermittent numbness in her feet. She feels "tired".  She attributes this to working 2 jobs.  She notes no significant improvement in how she feels overall with correction of the hemoglobin into normal range.  She has taken oral iron in the past, 1 tablet a day and tolerated without significant issues.  She has hypertension.  She takes Norvasc and hydrochlorothiazide.  Family history significant for mother with breast cancer.  Maternal uncle had colon cancer diagnosed age 48.  She reports "gynecological problems" in her mother and sister, possibly fibroids.  She lives in [place].  She is single.  No children.  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  No tobacco use.  Infrequent EtOH.  Objective:  Vital signs in last 24 hours:  Blood pressure 139/78, pulse (!) 55, temperature 97.7 F (36.5 C), temperature  source Oral, resp. rate 18, height 5\' 6"  (1.676 m), weight 287 lb 8 oz (130.4 kg), SpO2 100 %.    HEENT: Neck without mass. Lymphatics: No palpable cervical or supraclavicular lymph nodes. Resp: Lungs clear bilaterally. Cardio: Regular rate and rhythm. GI: Abdomen soft and nontender.  No hepatosplenomegaly. Vascular: No leg edema.   Lab Results:  Lab Results  Component Value Date   WBC 8.9 01/06/2019   HGB 12.3 01/06/2019   HCT 40.0 01/06/2019   MCV 80.6 01/06/2019   PLT 304 01/06/2019   NEUTROABS 6.5 01/06/2019    Imaging:  Mm Digital Screening Bilateral  Result Date: 01/05/2019 CLINICAL DATA:  Screening. EXAM: DIGITAL SCREENING BILATERAL MAMMOGRAM WITH CAD COMPARISON:  Previous exam(s). ACR Breast Density Category b: There are scattered areas of fibroglandular density. FINDINGS: There are no findings suspicious for malignancy. Images were processed with CAD. IMPRESSION: No mammographic evidence of malignancy. A result letter of this screening mammogram will be mailed directly to the patient. RECOMMENDATION: Screening mammogram in one year. (Code:SM-B-01Y) BI-RADS CATEGORY  1: Negative. Electronically Signed   By: Fidela Salisbury M.D.   On: 01/05/2019 10:47    Medications: I have reviewed the patient's current medications.  Assessment/Plan: 1. Iron deficiency anemia most likely secondary to longstanding menorrhagia.  Status post IV iron 09/23/2018, 10/13/2018 and 12/13/2018 2. Menorrhagia.  She is currently undergoing evaluation by gynecology. 3. Hypertension.  She takes Norvasc and hydrochlorothiazide.  Disposition: Kimberly Coleman appears stable.  She has iron deficiency anemia most likely secondary to longstanding menorrhagia.  She is currently undergoing evaluation by gynecology.  She received 3 doses of IV iron, most recently 12/13/2018.  We reviewed the CBC from today.  The hemoglobin and MCV have corrected into normal range.  She does not need IV iron at this time.  She  reports she is able to tolerate oral iron.  Dr. Benay Spice recommends she begin ferrous sulfate 325 mg twice daily.  She will return for a follow-up CBC and ferritin in 2 months.  We recommend she undergo a colonoscopy based on current screening recommendations.  She will discuss this further with her PCP.  She will return for lab and a follow-up visit in 4 months.  She will contact the office in the interim with any problems.  We specifically discussed inability to tolerate oral iron.  Patient seen with Dr. Benay Spice.  25 minutes were spent face-to-face at today's visit with the majority of that time involved in counseling/coordination of care.    Ned Card ANP/GNP-BC   01/06/2019  9:01 AM  This was a shared visit with Ned Card.  We reviewed the billable laboratory data.  Kimberly Coleman has iron deficiency anemia, likely secondary to menorrhagia.  The iron deficiency has almost completely corrected with IV iron therapy.  We recommend she begin oral iron replacement.  She will contact us if she cannot tolerate the oral iron.  We also recommended she undergo a screening colonoscopy to be arranged by her primary provider.  Julieanne Manson, MD

## 2019-02-01 DIAGNOSIS — R1030 Lower abdominal pain, unspecified: Secondary | ICD-10-CM | POA: Diagnosis not present

## 2019-02-01 DIAGNOSIS — Z3202 Encounter for pregnancy test, result negative: Secondary | ICD-10-CM | POA: Diagnosis not present

## 2019-02-01 DIAGNOSIS — N939 Abnormal uterine and vaginal bleeding, unspecified: Secondary | ICD-10-CM | POA: Diagnosis not present

## 2019-03-02 ENCOUNTER — Encounter: Payer: Self-pay | Admitting: *Deleted

## 2019-03-02 NOTE — Progress Notes (Signed)
Patient requesting labs due this month be collected at her PCP office. Faxed lab orders for CBC and Ferritin to Paul Half, PA-C with fax number for results.

## 2019-03-03 ENCOUNTER — Other Ambulatory Visit: Payer: Self-pay

## 2019-03-07 MED FILL — AMLODIPINE BESYLATE 5 MG TA: 5 | 90 days supply | Qty: 90 | Fill #1

## 2019-03-07 MED FILL — TRANEXAMIC ACID 650 MG TAB: 650 | 5 days supply | Qty: 30 | Fill #1

## 2019-03-07 MED FILL — HYDROCHLOROTHIAZIDE 25 MG T: 25 | 90 days supply | Qty: 90 | Fill #1

## 2019-03-21 DIAGNOSIS — E119 Type 2 diabetes mellitus without complications: Secondary | ICD-10-CM | POA: Diagnosis not present

## 2019-03-21 DIAGNOSIS — I10 Essential (primary) hypertension: Secondary | ICD-10-CM | POA: Diagnosis not present

## 2019-03-21 DIAGNOSIS — Z0000 Encounter for general adult medical examination without abnormal findings: Secondary | ICD-10-CM | POA: Diagnosis not present

## 2019-03-21 DIAGNOSIS — Z87892 Personal history of anaphylaxis: Secondary | ICD-10-CM | POA: Diagnosis not present

## 2019-03-21 DIAGNOSIS — D500 Iron deficiency anemia secondary to blood loss (chronic): Secondary | ICD-10-CM | POA: Diagnosis not present

## 2019-03-21 DIAGNOSIS — N921 Excessive and frequent menstruation with irregular cycle: Secondary | ICD-10-CM | POA: Diagnosis not present

## 2019-03-22 NOTE — Progress Notes (Signed)
Received labs from [facility] and forwarded to MD. Iron panel was done, but no ferritin (was requested).

## 2019-03-28 ENCOUNTER — Telehealth: Payer: Self-pay | Admitting: *Deleted

## 2019-03-28 NOTE — Telephone Encounter (Signed)
Attempted to call patient to let her know that Hgb is stable and continue to take her iron. Can follow up as scheduled or move out 1 month if desired.

## 2019-05-03 ENCOUNTER — Telehealth: Payer: Self-pay | Admitting: Oncology

## 2019-05-03 NOTE — Telephone Encounter (Signed)
Called patient regarding upcoming Webex appointment, patient has gotten lab work done elsewhere and will fax over results so per patient's request the lab has been cancelled. Patient will keep Webex appointment and e-mail has been sent.  Messgae to provider.

## 2019-05-05 ENCOUNTER — Inpatient Hospital Stay: Payer: 59 | Admitting: Oncology

## 2019-05-05 ENCOUNTER — Other Ambulatory Visit: Payer: Self-pay

## 2019-05-11 DIAGNOSIS — N921 Excessive and frequent menstruation with irregular cycle: Secondary | ICD-10-CM | POA: Diagnosis not present

## 2019-07-31 MED FILL — HYDROCHLOROTHIAZIDE 25 MG T: 25 | 90 days supply | Qty: 90 | Fill #0

## 2019-07-31 MED FILL — TRANEXAMIC ACID 650 MG TAB: 650 | 5 days supply | Qty: 30 | Fill #2

## 2019-07-31 MED FILL — AMLODIPINE BESYLATE 5 MG TA: 5 | 90 days supply | Qty: 90 | Fill #0

## 2019-09-18 ENCOUNTER — Other Ambulatory Visit (HOSPITAL_BASED_OUTPATIENT_CLINIC_OR_DEPARTMENT_OTHER): Payer: Self-pay | Admitting: Family Medicine

## 2019-09-18 ENCOUNTER — Ambulatory Visit (HOSPITAL_BASED_OUTPATIENT_CLINIC_OR_DEPARTMENT_OTHER)
Admission: RE | Admit: 2019-09-18 | Discharge: 2019-09-18 | Disposition: A | Discharge status: Home / Self Care | Payer: 59 | Source: Ambulatory Visit | Attending: Family Medicine | Admitting: Family Medicine

## 2019-09-18 ENCOUNTER — Other Ambulatory Visit: Payer: Self-pay

## 2019-09-18 DIAGNOSIS — M7989 Other specified soft tissue disorders: Secondary | ICD-10-CM

## 2019-09-18 DIAGNOSIS — R600 Localized edema: Secondary | ICD-10-CM | POA: Diagnosis not present

## 2019-09-18 DIAGNOSIS — R7989 Other specified abnormal findings of blood chemistry: Secondary | ICD-10-CM

## 2019-09-18 DIAGNOSIS — M25561 Pain in right knee: Secondary | ICD-10-CM | POA: Diagnosis not present

## 2019-09-18 MED FILL — NAPROXEN 500 MG TABLET: 500 | 14 days supply | Qty: 28 | Fill #0

## 2020-04-03 ENCOUNTER — Other Ambulatory Visit: Payer: Self-pay | Admitting: Obstetrics and Gynecology

## 2020-04-03 DIAGNOSIS — R3915 Urgency of urination: Secondary | ICD-10-CM | POA: Diagnosis not present

## 2020-04-03 DIAGNOSIS — Z1231 Encounter for screening mammogram for malignant neoplasm of breast: Secondary | ICD-10-CM

## 2020-04-03 DIAGNOSIS — N921 Excessive and frequent menstruation with irregular cycle: Secondary | ICD-10-CM | POA: Diagnosis not present

## 2020-04-03 DIAGNOSIS — Z01419 Encounter for gynecological examination (general) (routine) without abnormal findings: Secondary | ICD-10-CM | POA: Diagnosis not present

## 2020-04-05 DIAGNOSIS — D500 Iron deficiency anemia secondary to blood loss (chronic): Secondary | ICD-10-CM | POA: Diagnosis not present

## 2020-04-05 DIAGNOSIS — N921 Excessive and frequent menstruation with irregular cycle: Secondary | ICD-10-CM | POA: Diagnosis not present

## 2020-04-05 DIAGNOSIS — I10 Essential (primary) hypertension: Secondary | ICD-10-CM | POA: Diagnosis not present

## 2020-04-05 DIAGNOSIS — Z0000 Encounter for general adult medical examination without abnormal findings: Secondary | ICD-10-CM | POA: Diagnosis not present

## 2020-04-05 DIAGNOSIS — R3915 Urgency of urination: Secondary | ICD-10-CM | POA: Diagnosis not present

## 2020-04-05 DIAGNOSIS — E119 Type 2 diabetes mellitus without complications: Secondary | ICD-10-CM | POA: Diagnosis not present

## 2020-04-08 MED FILL — EPINEPHRINE 0.3 MG AUTO-INJ: 0.3 | 2 days supply | Qty: 2 | Fill #0

## 2020-04-11 ENCOUNTER — Telehealth: Payer: Self-pay | Admitting: Oncology

## 2020-04-11 NOTE — Telephone Encounter (Signed)
Called pt per 4/21 sch message - no answer and no vmail

## 2020-04-17 ENCOUNTER — Other Ambulatory Visit: Payer: Self-pay | Admitting: *Deleted

## 2020-04-17 DIAGNOSIS — D500 Iron deficiency anemia secondary to blood loss (chronic): Secondary | ICD-10-CM

## 2020-04-19 ENCOUNTER — Ambulatory Visit
Admission: RE | Admit: 2020-04-19 | Discharge: 2020-04-19 | Disposition: A | Discharge status: Home / Self Care | Payer: 59 | Source: Ambulatory Visit | Attending: Obstetrics and Gynecology | Admitting: Obstetrics and Gynecology

## 2020-04-19 ENCOUNTER — Other Ambulatory Visit: Payer: Self-pay

## 2020-04-19 DIAGNOSIS — Z1231 Encounter for screening mammogram for malignant neoplasm of breast: Secondary | ICD-10-CM | POA: Diagnosis not present

## 2020-04-26 ENCOUNTER — Other Ambulatory Visit: Payer: Self-pay | Admitting: *Deleted

## 2020-04-26 ENCOUNTER — Telehealth: Payer: Self-pay | Admitting: Oncology

## 2020-04-26 ENCOUNTER — Other Ambulatory Visit: Payer: Self-pay

## 2020-04-26 ENCOUNTER — Inpatient Hospital Stay: Payer: 59 | Attending: Oncology | Admitting: Oncology

## 2020-04-26 ENCOUNTER — Inpatient Hospital Stay: Payer: 59

## 2020-04-26 VITALS — BP 143/99 | HR 65 | Temp 97.6°F | Resp 18 | Ht 66.0 in | Wt 280.3 lb

## 2020-04-26 DIAGNOSIS — D500 Iron deficiency anemia secondary to blood loss (chronic): Secondary | ICD-10-CM

## 2020-04-26 DIAGNOSIS — D509 Iron deficiency anemia, unspecified: Secondary | ICD-10-CM | POA: Diagnosis not present

## 2020-04-26 DIAGNOSIS — Z8742 Personal history of other diseases of the female genital tract: Secondary | ICD-10-CM | POA: Insufficient documentation

## 2020-04-26 DIAGNOSIS — R0609 Other forms of dyspnea: Secondary | ICD-10-CM | POA: Insufficient documentation

## 2020-04-26 DIAGNOSIS — I10 Essential (primary) hypertension: Secondary | ICD-10-CM | POA: Diagnosis not present

## 2020-04-26 LAB — CBC WITH DIFFERENTIAL (CANCER CENTER ONLY)
Abs Immature Granulocytes: 0.02 10*3/uL (ref 0.00–0.07)
Basophils Absolute: 0.1 10*3/uL (ref 0.0–0.1)
Basophils Relative: 1 %
Eosinophils Absolute: 0.1 10*3/uL (ref 0.0–0.5)
Eosinophils Relative: 2 %
HCT: 35.6 % — ABNORMAL LOW (ref 36.0–46.0)
Hemoglobin: 9.6 g/dL — ABNORMAL LOW (ref 12.0–15.0)
Immature Granulocytes: 0 %
Lymphocytes Relative: 24 %
Lymphs Abs: 1.9 10*3/uL (ref 0.7–4.0)
MCH: 18.8 pg — ABNORMAL LOW (ref 26.0–34.0)
MCHC: 27 g/dL — ABNORMAL LOW (ref 30.0–36.0)
MCV: 69.5 fL — ABNORMAL LOW (ref 80.0–100.0)
Monocytes Absolute: 0.5 10*3/uL (ref 0.1–1.0)
Monocytes Relative: 6 %
Neutro Abs: 5.4 10*3/uL (ref 1.7–7.7)
Neutrophils Relative %: 67 %
Platelet Count: 334 10*3/uL (ref 150–400)
RBC: 5.12 MIL/uL — ABNORMAL HIGH (ref 3.87–5.11)
RDW: 18.1 % — ABNORMAL HIGH (ref 11.5–15.5)
WBC Count: 8 10*3/uL (ref 4.0–10.5)
nRBC: 0 % (ref 0.0–0.2)

## 2020-04-26 LAB — FERRITIN: Ferritin: 9 ng/mL — ABNORMAL LOW (ref 11–307)

## 2020-04-26 MED ORDER — FERROUS SULFATE 325 (65 FE) MG PO TBEC: 325.0000 mg | DELAYED_RELEASE_TABLET | Freq: Two times a day (BID) | ORAL | 0 refills | Status: AC

## 2020-04-26 NOTE — Telephone Encounter (Signed)
Scheduled appt per 5/7 los - pt aware of appts.  Referral placed into [facility]

## 2020-04-26 NOTE — Progress Notes (Signed)
  [facility] OFFICE PROGRESS NOTE   Diagnosis: Iron deficiency anemia  INTERVAL HISTORY:   Ms. Lacroix returns for a scheduled visit.  She feels well.  Good appetite.  No difficulty with bowel or bladder function.  The menstrual cycle has been less heavy for the past several months.  No other bleeding. She is taking an iron supplement from "[place] ".  She has mild exertional dyspnea. She had labs at [facility] on 04/10/2020.  The hemoglobin returned at 9.1 with an MCV of 64.3 and ferritin 6.4. Objective:  Vital signs in last 24 hours:  Blood pressure (!) 143/99, pulse 65, temperature 97.6 F (36.4 C), temperature source Oral, resp. rate 18, height 5\' 6"  (1.676 m), weight 280 lb 4.8 oz (127.1 kg), SpO2 99 %.    Lymphatics: No cervical, supraclavicular, or axillary nodes Resp: Lungs clear bilaterally Cardio: Regular rate and rhythm GI: No hepatosplenomegaly, nontender, no mass Vascular: Trace lower pretibial/ankle edema bilaterally    Lab Results:  Lab Results  Component Value Date   WBC 8.0 04/26/2020   HGB 9.6 (L) 04/26/2020   HCT 35.6 (L) 04/26/2020   MCV 69.5 (L) 04/26/2020   PLT 334 04/26/2020   NEUTROABS 5.4 04/26/2020     Medications: I have reviewed the patient's current medications.   Assessment/Plan: 1. Iron deficiency anemia most likely secondary to longstanding menorrhagia.  Status post IV iron 09/23/2018, 10/13/2018 and 12/13/2018 2. History of menorrhagia. Followed by gynecology 3. Hypertension.  Not taking her blood pressure medication at present    Disposition: Ms. Caprice Red has recurrent iron deficiency anemia.  The iron deficiency is likely secondary to menorrhagia.  She has not undergone colonoscopy screening.  We will check stool Hemoccults and refer her for a screening colonoscopy.  She will begin ferrous sulfate, 325 mg twice daily.  She will return for an office visit and CBC in 1 month.  She reports tolerating oral iron well in the  past.  Betsy Coder, MD  04/26/2020  9:17 AM

## 2020-04-26 NOTE — Progress Notes (Signed)
Faxed referral order, demographics, insurance and chart information to [facility] [phone].

## 2020-05-08 ENCOUNTER — Other Ambulatory Visit: Payer: Self-pay

## 2020-05-08 DIAGNOSIS — D509 Iron deficiency anemia, unspecified: Secondary | ICD-10-CM | POA: Diagnosis not present

## 2020-05-08 DIAGNOSIS — R0609 Other forms of dyspnea: Secondary | ICD-10-CM | POA: Diagnosis not present

## 2020-05-08 DIAGNOSIS — Z8742 Personal history of other diseases of the female genital tract: Secondary | ICD-10-CM | POA: Diagnosis not present

## 2020-05-08 DIAGNOSIS — I10 Essential (primary) hypertension: Secondary | ICD-10-CM | POA: Diagnosis not present

## 2020-05-08 DIAGNOSIS — D500 Iron deficiency anemia secondary to blood loss (chronic): Secondary | ICD-10-CM

## 2020-05-08 LAB — OCCULT BLOOD X 1 CARD TO LAB, STOOL
Fecal Occult Bld: NEGATIVE
Fecal Occult Bld: NEGATIVE
Fecal Occult Bld: NEGATIVE

## 2020-05-29 ENCOUNTER — Telehealth: Payer: Self-pay | Admitting: *Deleted

## 2020-05-29 DIAGNOSIS — Z1211 Encounter for screening for malignant neoplasm of colon: Secondary | ICD-10-CM | POA: Diagnosis not present

## 2020-05-29 DIAGNOSIS — K5900 Constipation, unspecified: Secondary | ICD-10-CM | POA: Diagnosis not present

## 2020-05-29 DIAGNOSIS — D509 Iron deficiency anemia, unspecified: Secondary | ICD-10-CM | POA: Diagnosis not present

## 2020-05-29 NOTE — Telephone Encounter (Signed)
Asking if she should cancel her 6/11 Lab/ visit here till after her upper endoscopy and colonoscopy on 06/14/20?

## 2020-05-31 ENCOUNTER — Inpatient Hospital Stay: Payer: 59 | Admitting: Nurse Practitioner

## 2020-05-31 ENCOUNTER — Telehealth: Payer: Self-pay | Admitting: Oncology

## 2020-05-31 ENCOUNTER — Inpatient Hospital Stay: Payer: 59

## 2020-05-31 ENCOUNTER — Telehealth: Payer: Self-pay

## 2020-05-31 NOTE — Telephone Encounter (Signed)
R/s appt per 6/11 sch message - pt is aware of new appt.

## 2020-05-31 NOTE — Telephone Encounter (Signed)
Called patient to check on her since I noticed that she was late for her appointment. Patient stated that she would like to change her appointments until after she sees another doctor on 6/22. Sent schedule message to get patient rescheduled for 06/14/20

## 2020-06-11 DIAGNOSIS — Z1159 Encounter for screening for other viral diseases: Secondary | ICD-10-CM | POA: Diagnosis not present

## 2020-06-14 ENCOUNTER — Ambulatory Visit: Payer: 59 | Admitting: Nurse Practitioner

## 2020-06-14 ENCOUNTER — Other Ambulatory Visit: Payer: 59

## 2020-06-14 DIAGNOSIS — D509 Iron deficiency anemia, unspecified: Secondary | ICD-10-CM | POA: Diagnosis not present

## 2020-06-14 DIAGNOSIS — K259 Gastric ulcer, unspecified as acute or chronic, without hemorrhage or perforation: Secondary | ICD-10-CM | POA: Diagnosis not present

## 2020-06-14 DIAGNOSIS — K648 Other hemorrhoids: Secondary | ICD-10-CM | POA: Diagnosis not present

## 2020-06-14 DIAGNOSIS — K2930 Chronic superficial gastritis without bleeding: Secondary | ICD-10-CM | POA: Diagnosis not present

## 2020-06-14 DIAGNOSIS — Z1211 Encounter for screening for malignant neoplasm of colon: Secondary | ICD-10-CM | POA: Diagnosis not present

## 2020-06-14 MED FILL — PANTOPRAZOLE SOD DR 40 MG T: 40 | 30 days supply | Qty: 30 | Fill #0

## 2020-06-18 ENCOUNTER — Telehealth: Payer: Self-pay | Admitting: Oncology

## 2020-06-18 ENCOUNTER — Inpatient Hospital Stay (HOSPITAL_BASED_OUTPATIENT_CLINIC_OR_DEPARTMENT_OTHER): Payer: 59 | Admitting: Nurse Practitioner

## 2020-06-18 ENCOUNTER — Encounter: Payer: Self-pay | Admitting: Nurse Practitioner

## 2020-06-18 ENCOUNTER — Other Ambulatory Visit: Payer: Self-pay

## 2020-06-18 ENCOUNTER — Inpatient Hospital Stay: Payer: 59 | Attending: Oncology

## 2020-06-18 ENCOUNTER — Telehealth: Payer: Self-pay | Admitting: Emergency Medicine

## 2020-06-18 VITALS — BP 160/95 | HR 62 | Temp 97.8°F | Resp 17 | Ht 66.0 in | Wt 282.5 lb

## 2020-06-18 DIAGNOSIS — D509 Iron deficiency anemia, unspecified: Secondary | ICD-10-CM | POA: Diagnosis not present

## 2020-06-18 DIAGNOSIS — I10 Essential (primary) hypertension: Secondary | ICD-10-CM | POA: Insufficient documentation

## 2020-06-18 DIAGNOSIS — N920 Excessive and frequent menstruation with regular cycle: Secondary | ICD-10-CM | POA: Insufficient documentation

## 2020-06-18 DIAGNOSIS — D500 Iron deficiency anemia secondary to blood loss (chronic): Secondary | ICD-10-CM | POA: Diagnosis not present

## 2020-06-18 LAB — CBC WITH DIFFERENTIAL (CANCER CENTER ONLY)
Abs Immature Granulocytes: 0.02 10*3/uL (ref 0.00–0.07)
Basophils Absolute: 0.1 10*3/uL (ref 0.0–0.1)
Basophils Relative: 1 %
Eosinophils Absolute: 0.2 10*3/uL (ref 0.0–0.5)
Eosinophils Relative: 2 %
HCT: 34.1 % — ABNORMAL LOW (ref 36.0–46.0)
Hemoglobin: 9.8 g/dL — ABNORMAL LOW (ref 12.0–15.0)
Immature Granulocytes: 0 %
Lymphocytes Relative: 27 %
Lymphs Abs: 2.4 10*3/uL (ref 0.7–4.0)
MCH: 21 pg — ABNORMAL LOW (ref 26.0–34.0)
MCHC: 28.7 g/dL — ABNORMAL LOW (ref 30.0–36.0)
MCV: 73 fL — ABNORMAL LOW (ref 80.0–100.0)
Monocytes Absolute: 0.7 10*3/uL (ref 0.1–1.0)
Monocytes Relative: 8 %
Neutro Abs: 5.7 10*3/uL (ref 1.7–7.7)
Neutrophils Relative %: 62 %
Platelet Count: 337 10*3/uL (ref 150–400)
RBC: 4.67 MIL/uL (ref 3.87–5.11)
RDW: 19.7 % — ABNORMAL HIGH (ref 11.5–15.5)
WBC Count: 9.1 10*3/uL (ref 4.0–10.5)
nRBC: 0 % (ref 0.0–0.2)

## 2020-06-18 LAB — FERRITIN: Ferritin: 21 ng/mL (ref 11–307)

## 2020-06-18 NOTE — Telephone Encounter (Signed)
Scheduled appt per 6/29 los - pt is aware.

## 2020-06-18 NOTE — Telephone Encounter (Signed)
VM left with [facility] per Lattie Haw, NP request. Copies of upper endo and colonoscopy requested to be faxed to [phone].

## 2020-06-18 NOTE — Progress Notes (Signed)
  [facility] OFFICE PROGRESS NOTE   Diagnosis: Iron deficiency anemia  INTERVAL HISTORY:   Ms. Scheuermann returns as scheduled.  She is taking 2 iron tablets a day.  She notes some nausea and constipation.  Menstrual cycle is described as heavy for 9 of 10 days.  No other bleeding.  She reports a nonbleeding gastric ulcer was identified on the recent upper endoscopy, colonoscopy negative.  She is fatigued.  Objective:  Vital signs in last 24 hours:  Blood pressure (!) 160/95, pulse 62, temperature 97.8 F (36.6 C), temperature source Temporal, resp. rate 17, height 5\' 6"  (1.676 m), weight 282 lb 8 oz (128.1 kg), SpO2 100 %.    Resp: Lungs clear bilaterally. Cardio: Regular rate and rhythm. GI: Abdomen soft and nontender.  No hepatomegaly. Vascular: No leg edema.   Lab Results:  Lab Results  Component Value Date   WBC 9.1 06/18/2020   HGB 9.8 (L) 06/18/2020   HCT 34.1 (L) 06/18/2020   MCV 73.0 (L) 06/18/2020   PLT 337 06/18/2020   NEUTROABS 5.7 06/18/2020    Imaging:  No results found.  Medications: I have reviewed the patient's current medications.  Assessment/Plan: 1. Iron deficiency anemia most likely secondary to longstanding menorrhagia.  Status post IV iron 09/23/2018, 10/13/2018 and 12/13/2018 2. History of menorrhagia. Followed by gynecology 3. Hypertension.  Not taking her blood pressure medication at present  Disposition: Ms. Wyrick appears stable.  Per her report recent colonoscopy was negative and upper endoscopy showed a small nonbleeding gastric ulcer.  We will contact her gastroenterologist office for a copy of the reports.  We reviewed the CBC from today.  She has a stable microcytic anemia.  Ferritin is pending.  The iron deficiency is likely secondary to menorrhagia.  We recommend she follow-up with her gynecologist.  She will continue oral iron twice daily.  Sign/symptoms suggestive of progressive anemia reviewed with her at today's visit.   She will contact the office should she develop any of these.  She will return for lab and follow-up in 6 weeks.  Plan reviewed with Dr. Benay Spice.    Ned Card ANP/GNP-BC   06/18/2020  1:01 PM

## 2020-07-15 ENCOUNTER — Telehealth: Payer: Self-pay | Admitting: Oncology

## 2020-07-15 NOTE — Telephone Encounter (Signed)
Rescheduled appts per printed document that was given to me with written instructions to reschedule appts. Pt's voicemail was full. Mailed appt reminder and calendar.

## 2020-07-30 ENCOUNTER — Ambulatory Visit: Payer: 59 | Admitting: Oncology

## 2020-07-30 ENCOUNTER — Other Ambulatory Visit: Payer: 59

## 2020-07-31 MED FILL — LISINOPRIL 10 MG TABS: 10 | 90 days supply | Qty: 90 | Fill #0

## 2020-08-06 ENCOUNTER — Ambulatory Visit: Payer: 59 | Admitting: Nurse Practitioner

## 2020-08-06 ENCOUNTER — Other Ambulatory Visit: Payer: 59

## 2021-05-12 ENCOUNTER — Encounter: Payer: Self-pay | Admitting: Hematology and Oncology

## 2021-05-12 ENCOUNTER — Other Ambulatory Visit (HOSPITAL_COMMUNITY): Payer: Self-pay

## 2021-05-12 MED ORDER — PREDNISONE 20 MG PO TABS
ORAL_TABLET | ORAL | 0 refills | Status: DC
  Filled 2021-05-12: qty 18, 9d supply, fill #0

## 2021-07-02 IMAGING — MG DIGITAL SCREENING BILAT W/ TOMO W/ CAD
6 of 10 series · 6 of 30 positions shown · non-contrast
Comparison: Previous exam(s).

CLINICAL DATA: Screening.

EXAM:
DIGITAL SCREENING BILATERAL MAMMOGRAM WITH TOMO AND CAD

[L MLO synth-2D]
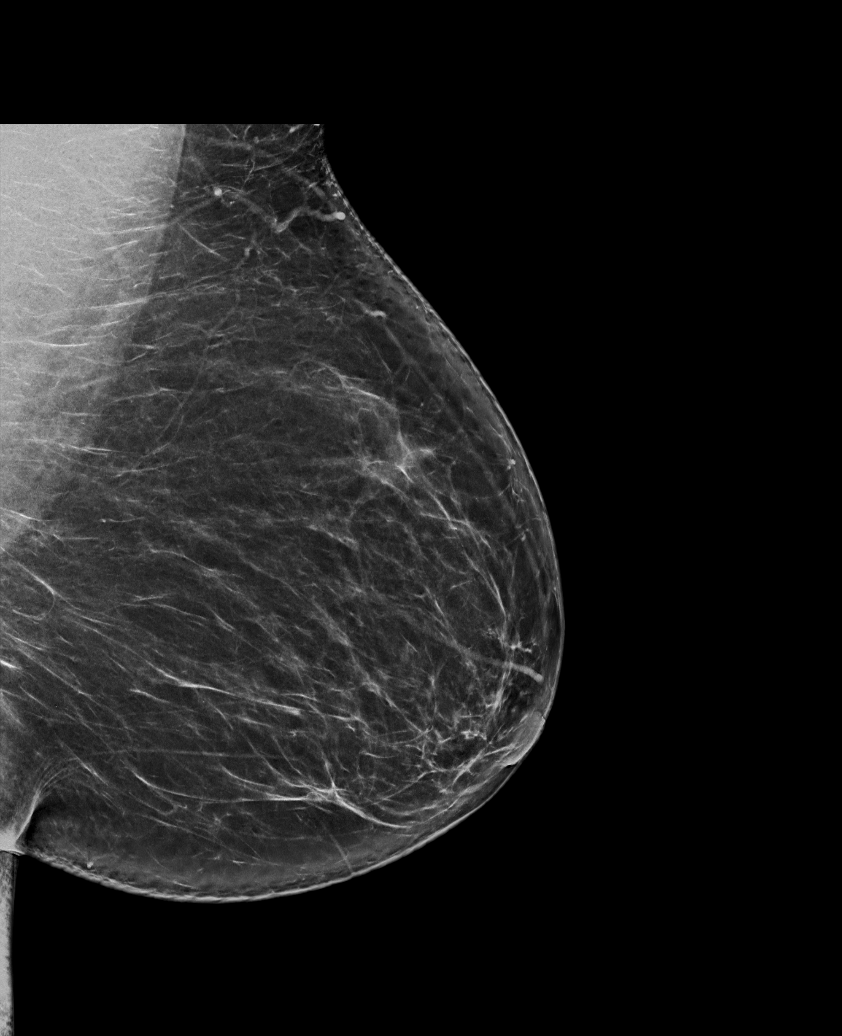

[L CC synth-2D (1 of 2)]
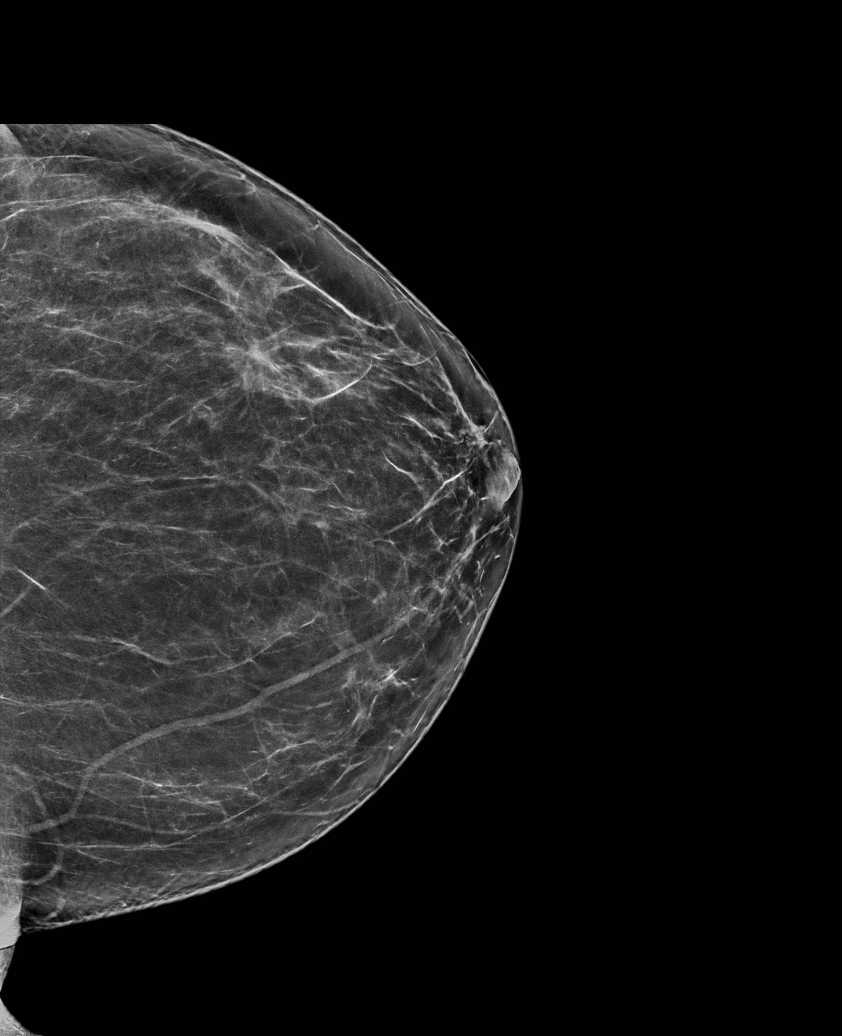

[R MLO synth-2D]
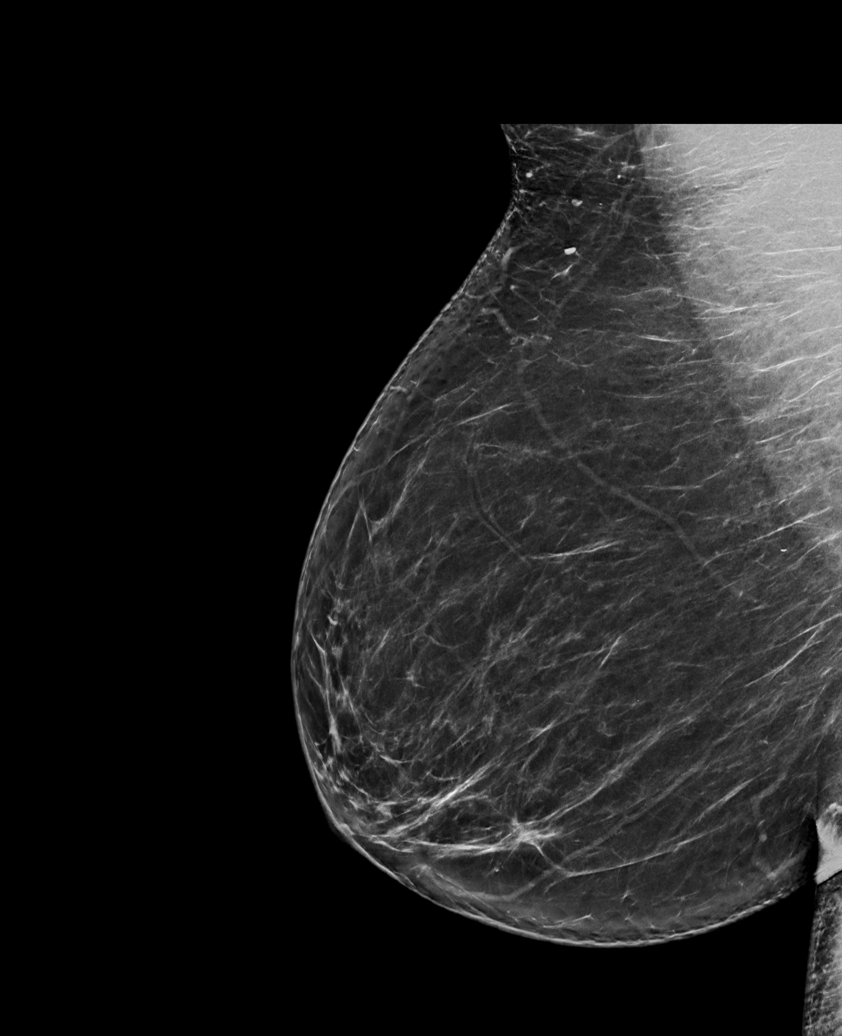

[R CC synth-2D]
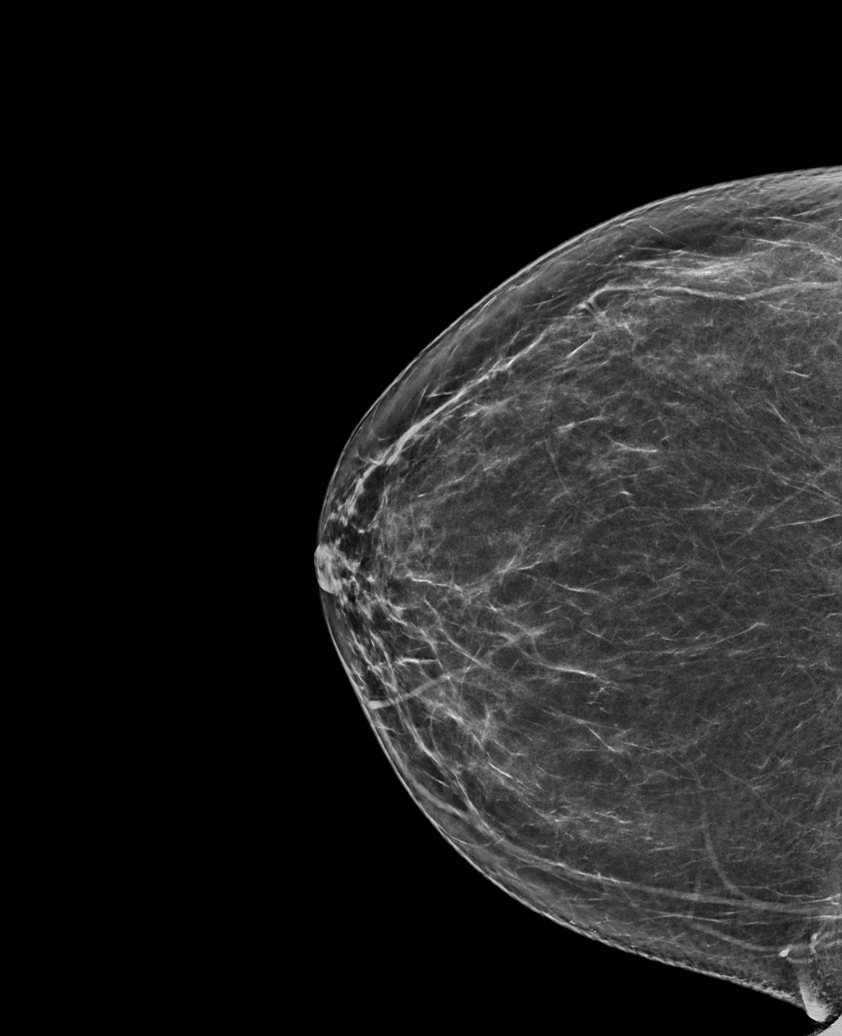

[L CC synth-2D (2 of 2)]
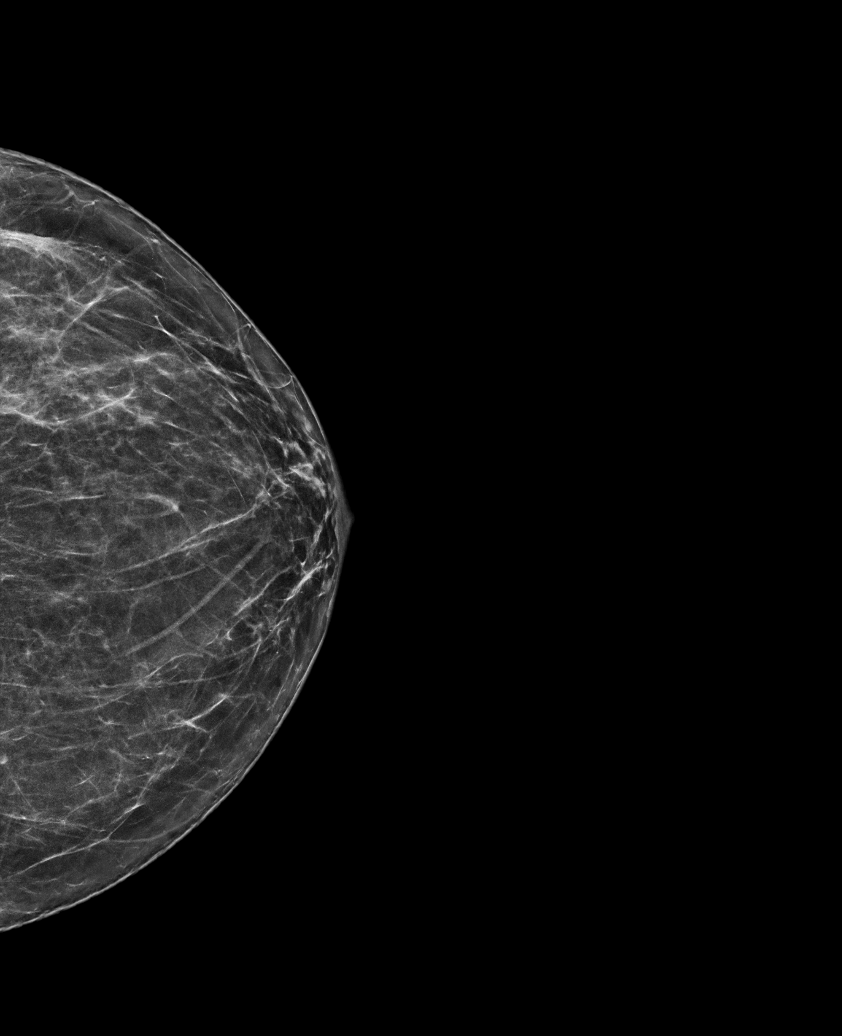

[R CC tomo · tomo slice 37/73.0]
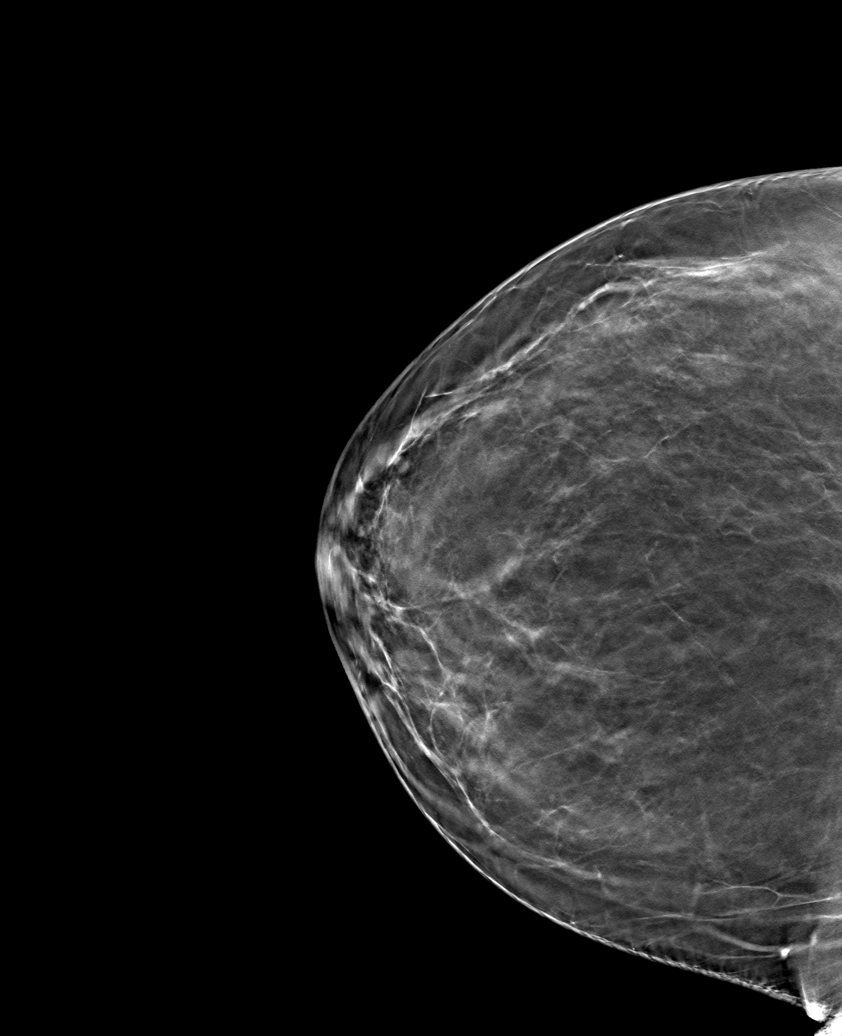

[6 of 30 positions shown; findings below may reference images not displayed]

ACR Breast Density Category b: There are scattered areas of
fibroglandular density.
FINDINGS: There are no findings suspicious for malignancy. Images were
processed with CAD.
IMPRESSION: No mammographic evidence of malignancy. A result letter of this
screening mammogram will be mailed directly to the patient.

RECOMMENDATION:
Screening mammogram in one year. (Code:[id number])

BI-RADS CATEGORY  1: Negative.

## 2022-01-15 ENCOUNTER — Ambulatory Visit: Payer: Self-pay | Attending: Internal Medicine

## 2022-01-15 ENCOUNTER — Encounter: Payer: Self-pay | Admitting: Hematology and Oncology

## 2022-01-15 ENCOUNTER — Other Ambulatory Visit (HOSPITAL_BASED_OUTPATIENT_CLINIC_OR_DEPARTMENT_OTHER): Payer: Self-pay

## 2022-01-15 DIAGNOSIS — Z23 Encounter for immunization: Secondary | ICD-10-CM

## 2022-01-15 MED ORDER — PFIZER COVID-19 VAC BIVALENT 30 MCG/0.3ML IM SUSP
INTRAMUSCULAR | 0 refills | Status: DC
  Filled 2022-01-15: qty 0.3, 1d supply, fill #0

## 2022-01-15 NOTE — Progress Notes (Signed)
° °  Covid-19 Vaccination Clinic  Name:  Kimberly Coleman    MRN: [id number] DOB: Dec 09, 1971  01/15/2022  [name] was observed post Covid-19 immunization for 15 minutes without incident. She was provided with Vaccine Information Sheet and instruction to access the V-Safe system.   [name] was instructed to call 911 with any severe reactions post vaccine: Difficulty breathing  Swelling of face and throat  A fast heartbeat  A bad rash all over body  Dizziness and weakness   Immunizations Administered     Name Date Dose VIS Date Route   Pfizer Covid-19 Vaccine Bivalent Booster 01/15/2022  1:49 PM 0.3 mL 08/20/2021 Intramuscular   Manufacturer: [place]   Lot: [id number]   [place]: [phone]

## 2022-07-24 ENCOUNTER — Other Ambulatory Visit (HOSPITAL_COMMUNITY): Payer: Self-pay

## 2022-07-24 ENCOUNTER — Encounter: Payer: Self-pay | Admitting: Hematology and Oncology

## 2022-07-24 MED ORDER — TRIAMCINOLONE ACETONIDE 0.1 % EX CREA
TOPICAL_CREAM | Freq: Two times a day (BID) | CUTANEOUS | 0 refills | Status: DC
  Filled 2022-07-24: qty 30, 14d supply, fill #0

## 2022-08-14 ENCOUNTER — Other Ambulatory Visit: Payer: Self-pay

## 2022-08-14 ENCOUNTER — Encounter (HOSPITAL_BASED_OUTPATIENT_CLINIC_OR_DEPARTMENT_OTHER): Payer: Self-pay | Admitting: Emergency Medicine

## 2022-08-14 ENCOUNTER — Emergency Department (HOSPITAL_BASED_OUTPATIENT_CLINIC_OR_DEPARTMENT_OTHER): Payer: PRIVATE HEALTH INSURANCE

## 2022-08-14 ENCOUNTER — Emergency Department (HOSPITAL_BASED_OUTPATIENT_CLINIC_OR_DEPARTMENT_OTHER)
Admission: EM | Admit: 2022-08-14 | Discharge: 2022-08-14 | Disposition: A | Discharge status: Home / Self Care | Payer: PRIVATE HEALTH INSURANCE | Attending: Emergency Medicine | Admitting: Emergency Medicine

## 2022-08-14 ENCOUNTER — Encounter: Payer: Self-pay | Admitting: Hematology and Oncology

## 2022-08-14 DIAGNOSIS — X58XXXA Exposure to other specified factors, initial encounter: Secondary | ICD-10-CM | POA: Insufficient documentation

## 2022-08-14 DIAGNOSIS — S060X0A Concussion without loss of consciousness, initial encounter: Secondary | ICD-10-CM | POA: Insufficient documentation

## 2022-08-14 DIAGNOSIS — R110 Nausea: Secondary | ICD-10-CM | POA: Diagnosis not present

## 2022-08-14 DIAGNOSIS — R42 Dizziness and giddiness: Secondary | ICD-10-CM | POA: Insufficient documentation

## 2022-08-14 DIAGNOSIS — S0990XA Unspecified injury of head, initial encounter: Secondary | ICD-10-CM | POA: Diagnosis present

## 2022-08-14 NOTE — ED Notes (Signed)
Patient Alert and oriented to baseline. Stable and ambulatory to baseline. Patient verbalized understanding of the discharge instructions.  Patient belongings were taken by the patient.   

## 2022-08-14 NOTE — ED Triage Notes (Signed)
Pt arrives to ED with c/o head injury that occurred on 8/1 during at [facility] class. Associated symptoms include nausea, dizziness.

## 2022-08-14 NOTE — ED Provider Notes (Signed)
[facility] EMERGENCY DEPT Provider Note   CSN: [id number] Arrival date & time: 08/14/22  1761     History  Chief Complaint  Patient presents with   Head Injury    Kimberly Coleman is a 51 y.o. female.  Patient is a 51 year old female who presents with a head injury.  She said this happened on August 1.  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  She knocked heads with another classmate.  She said she thought things went black for second but she did not actually lose consciousness.  She had a more significant headache for about 5 days after and then it seemed to be more dull.  However over the last week she has had similar nausea and increased dizziness and feeling a little bit more unsteady when she is walking.  She says at times she feels like she has some twitches mostly on her left side but sometimes on her right side.  Involves the whole side of her body.  No generalized seizures.  No change in mental status with these episodes.  She is not on anticoagulants.  She denies any other injuries from the incident.       Home Medications Prior to Admission medications   Medication Sig Start Date End Date Taking? Authorizing Provider  b complex vitamins capsule Take 1 capsule by mouth daily.      [provider]  COVID-19 mRNA bivalent vaccine, Pfizer, (PFIZER COVID-19 VAC BIVALENT) injection Inject into the muscle. 01/15/22   Carlyle Basques, MD  EPINEPHrine 0.3 mg/0.3 mL IJ SOAJ injection USE AS DIRECTED AS NEEDED FOR ALLERGIC REACTION INJECTION Patient not taking: Reported on 06/18/2020 04/08/20   [provider]  ferrous sulfate 325 (65 FE) MG EC tablet Take 1 tablet (325 mg total) by mouth 2 (two) times daily with a meal. 04/26/20   Ladell Pier, MD  lisinopril (ZESTRIL) 10 MG tablet Take 10 mg by mouth daily. 04/29/20   [provider]  pantoprazole (PROTONIX) 40 MG  tablet Take 40 mg by mouth every morning. Patient not taking: Reported on 06/18/2020 06/14/20   [provider]  predniSONE (DELTASONE) 20 MG tablet Take 3 tablets (60 mg total) by mouth 1 (one) time each day for 3 days, THEN 2 tablets (40 mg total) 1 (one) time each day for 3 days, THEN 1 tablet (20 mg total) 1 (one) time each day for 3 days. 05/12/21     triamcinolone cream (KENALOG) 0.1 % Apply topically 2 (two) times daily for 14 days 07/24/22         Allergies    Other and Percocet [oxycodone-acetaminophen]    Review of Systems   Review of Systems  Constitutional:  Negative for chills, diaphoresis, fatigue and fever.  HENT:  Negative for congestion, rhinorrhea and sneezing.   Eyes: Negative.   Respiratory:  Negative for cough, chest tightness and shortness of breath.   Cardiovascular:  Negative for chest pain and leg swelling.  Gastrointestinal:  Positive for nausea. Negative for abdominal pain, blood in stool, diarrhea and vomiting.  Genitourinary:  Negative for difficulty urinating, flank pain, frequency and hematuria.  Musculoskeletal:  Negative for arthralgias and back pain.  Skin:  Negative for rash.  Neurological:  Positive for dizziness and headaches. Negative for speech difficulty, weakness and numbness.    Physical Exam Updated Vital Signs BP (!) 182/104 (BP Location: Left Arm)  Pulse 69   Temp 98.5 F (36.9 C) (Oral)   Resp 18   Ht '5\' 6"'$  (1.676 m)   Wt 127.9 kg   SpO2 100%   BMI 45.52 kg/m  Physical Exam Constitutional:      Appearance: She is well-developed.  HENT:     Head: Normocephalic and atraumatic.  Eyes:     Pupils: Pupils are equal, round, and reactive to light.  Cardiovascular:     Rate and Rhythm: Normal rate and regular rhythm.     Heart sounds: Normal heart sounds.  Pulmonary:     Effort: Pulmonary effort is normal. No respiratory distress.     Breath sounds: Normal breath sounds. No wheezing or rales.  Chest:     Chest wall: No  tenderness.  Abdominal:     General: Bowel sounds are normal.     Palpations: Abdomen is soft.     Tenderness: There is no abdominal tenderness. There is no guarding or rebound.  Musculoskeletal:        General: Normal range of motion.     Cervical back: Normal range of motion and neck supple.  Lymphadenopathy:     Cervical: No cervical adenopathy.  Skin:    General: Skin is warm and dry.     Findings: No rash.  Neurological:     General: No focal deficit present.     Mental Status: She is alert and oriented to person, place, and time.     Comments: Motor 5/5 all extremities Sensation grossly intact to LT all extremities Finger to Nose intact, no pronator drift CN II-XII grossly intact Gait normal      ED Results / Procedures / Treatments   Labs (all labs ordered are listed, but only abnormal results are displayed) Labs Reviewed - No data to display  EKG None  Radiology CT Head Wo Contrast  Result Date: 08/14/2022 CLINICAL DATA:  Head trauma, focal neuro findings. EXAM: CT HEAD WITHOUT CONTRAST TECHNIQUE: Contiguous axial images were obtained from the base of the skull through the vertex without intravenous contrast. RADIATION DOSE REDUCTION: This exam was performed according to the departmental dose-optimization program which includes automated exposure control, adjustment of the mA and/or kV according to patient size and/or use of iterative reconstruction technique. COMPARISON:  None Available. FINDINGS: Brain: No evidence of acute infarction, hemorrhage, hydrocephalus, extra-axial collection or mass lesion/mass effect. Focal subcentimeter left dural calcification. Vascular: No hyperdense vessel or unexpected calcification. Skull: Normal. Negative for fracture or focal lesion. Sinuses/Orbits: No acute finding. Other: None. IMPRESSION: No acute intracranial abnormality. Electronically Signed   By: Keane Police D.O.   On: 08/14/2022 11:21    Procedures Procedures     Medications Ordered in ED Medications - No data to display  ED Course/ Medical Decision Making/ A&P                           Medical Decision Making Problems Addressed: Concussion without loss of consciousness, initial encounter: acute illness or injury with systemic symptoms  Amount and/or Complexity of Data Reviewed External Data Reviewed: notes. Radiology: ordered. Decision-making details documented in ED Course.  Risk OTC drugs.   Patient is a 51 year old female who presents with symptoms after a head injury.  She does not have any neck pain or concerns for arterial injury.  She had a head CT which shows no acute abnormality.  She is neurologically intact.  She does report some worsening concussion symptoms  and some intermittent twitching episodes.  We will do an ambulatory referral to neurology.  She does not have a PCP at this time.  Return precautions were given.  Final Clinical Impression(s) / ED Diagnoses Final diagnoses:  Concussion without loss of consciousness, initial encounter    Rx / DC Orders ED Discharge Orders          Ordered    Ambulatory referral to Neurology       Comments: An appointment is requested in approximately: 1 week   08/14/22 1142              Malvin Johns, MD 08/14/22 1144

## 2022-08-14 NOTE — Discharge Instructions (Addendum)
Follow-up with the neurologist as discussed.  Return to the emergency room if you have any worsening symptoms.

## 2022-08-18 ENCOUNTER — Encounter: Payer: Self-pay | Admitting: Neurology

## 2022-09-09 ENCOUNTER — Encounter: Payer: Self-pay | Admitting: Hematology and Oncology

## 2022-09-09 ENCOUNTER — Other Ambulatory Visit (HOSPITAL_COMMUNITY): Payer: Self-pay

## 2022-09-09 MED ORDER — LIOTHYRONINE SODIUM 5 MCG PO TABS
ORAL_TABLET | ORAL | 1 refills | Status: DC
  Filled 2022-09-09: qty 60, 30d supply, fill #0
  Filled 2022-10-15: qty 60, 7d supply, fill #1

## 2022-09-25 ENCOUNTER — Encounter: Payer: Self-pay | Admitting: Neurology

## 2022-09-25 ENCOUNTER — Encounter: Payer: Self-pay | Admitting: Hematology and Oncology

## 2022-09-25 ENCOUNTER — Other Ambulatory Visit (HOSPITAL_COMMUNITY): Payer: Self-pay

## 2022-09-25 ENCOUNTER — Ambulatory Visit (INDEPENDENT_AMBULATORY_CARE_PROVIDER_SITE_OTHER): Payer: Self-pay | Admitting: Neurology

## 2022-09-25 VITALS — BP 162/82 | HR 77 | Ht 65.0 in | Wt 292.0 lb

## 2022-09-25 DIAGNOSIS — F0781 Postconcussional syndrome: Secondary | ICD-10-CM

## 2022-09-25 DIAGNOSIS — R253 Fasciculation: Secondary | ICD-10-CM

## 2022-09-25 MED ORDER — AMITRIPTYLINE HCL 10 MG PO TABS
ORAL_TABLET | ORAL | 6 refills | Status: DC
  Filled 2022-09-25: qty 60, 30d supply, fill #0
  Filled 2022-11-09: qty 60, 30d supply, fill #1
  Filled 2022-12-07: qty 60, 30d supply, fill #2
  Filled 2023-01-06: qty 60, 30d supply, fill #3

## 2022-09-25 NOTE — Progress Notes (Signed)
NEUROLOGY CONSULTATION NOTE  Kimberly Coleman MRN: [id number] DOB: January 21, 1971  Referring provider: Dr. Malvin Johns (ER) Primary care provider: none listed  Reason for consult:  twitching after a concussion  Dear Dr Tamera Punt:  Thank you for your kind referral of Kimberly Coleman for consultation of the above symptoms. Although her history is well known to you, please allow me to reiterate it for the purpose of our medical record. She is alone in the office today. Records and images were personally reviewed where available.   HISTORY OF PRESENT ILLNESS: This is a 51 year old right-handed woman with no significant past medical history, in her usual state of health until 07/21/2022. She works at [employer] and while doing [employer], she knocked heads with another co-worker, hitting the right side of her head. Her "bell was rung," everything went dark purple and she had a bad headache for a couple of days. The headache however persisted, localized over the right parietal region, feeling like an outline like something is hot and radiating from the middle of it. She reports that 2 weeks went by with continued headaches, then "things got weird," headaches persisted and she just felt off center. She went to Urgent Care and was told she had a concussion. She started having nausea, lightheadedness when standing, a hard time tracking. She would be talking and lose her tracking. The biggest symptoms bothering her were the headaches, nausea, and dizziness, when she gets in a car, she would get very nauseated and have to close her eyes and recline her seat. She would usually drive twice a month to [place] for work but has been unable to do this. Currently she only drives between home and work. She used to play [employer] doing fitness activities but had to stop because she got very dizzy. She has also had new symptoms of twitching that started on the left side of her body, either her left arm or leg would  twitch, then she also noticed it on the right side. She can almost tell before it happens, like a "pulse in my brain," then there is a quick movement and her whole shoulder would twitch. They mostly occur when she is relaxing, she has not noticed the twitching when standing. She has been working the same job for 8 years where she would work day shift and night shift without issues, however since the head injury she would sleep randomly, falling asleep at her desk at work even on day shift. 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 She usually gets 5 hours of sleep. She denies any prior history of headaches. The headaches have subsided a lot, they are not as intense but still occurring almost daily over the right parietal region. She does not take any pain medication. She denies any diplopia, dysarthria/dysphagia. She denies any falls but is "naturally clumsy." When she first had symptoms, she was listing to the right and holding on to things, but this is better. When asked about memory, she states "it depends," but has noticed she is having more issues remembering things. She denies getting lost driving but uses her GPS more. She has had minor head injuries when younger, no neurosurgical procedures. She was recently started on Cytomel after having abnormal thyroid testing, she thinks this is causing ankle swelling. No change in symptoms since starting thyroid medication. When asked about anxiety, she states "I've been anxious my whole life." She lives alone. She will be going  on a 7-day cruise next week.  I personally reviewed head CT without contrast done 08/14/22 which did not show any acute changes, there was a subcentimeter left dural calcification at the falx.   PAST MEDICAL HISTORY: Past Medical History:  Diagnosis Date   Anemia    Fibroid 1997    PAST SURGICAL HISTORY: Past Surgical History:  Procedure Laterality Date   DILATION AND CURETTAGE OF UTERUS  2002    abn bleeding    MEDICATIONS: Current Outpatient Medications on File Prior to Visit  Medication Sig Dispense Refill   b complex vitamins capsule Take 1 capsule by mouth daily.       EPINEPHrine 0.3 mg/0.3 mL IJ SOAJ injection USE AS DIRECTED AS NEEDED FOR ALLERGIC REACTION INJECTION     ferrous sulfate 325 (65 FE) MG EC tablet Take 1 tablet (325 mg total) by mouth 2 (two) times daily with a meal.  0   liothyronine (CYTOMEL) 5 MCG tablet Take 1 tablet (5 mcg total) by mouth daily on an empty stomach for 7 days, THEN 2 tablets (10 mcg total) daily. 60 tablet 1   Omega-3 Fatty Acids (FISH OIL) 1000 MG CAPS Take by mouth.     triamcinolone cream (KENALOG) 0.1 % Apply topically 2 (two) times daily for 14 days 30 g 0   No current facility-administered medications on file prior to visit.    ALLERGIES: Allergies  Allergen Reactions   Other Anaphylaxis, Shortness Of Breath and Itching    CASHEWS   Percocet [Oxycodone-Acetaminophen] Nausea And Vomiting    FAMILY HISTORY: Family History  Problem Relation Age of Onset   Diabetes Mother    Hypertension Father    Cancer Other     SOCIAL HISTORY: Social History   Socioeconomic History   Marital status: Single    Spouse name: Not on file   Number of children: Not on file   Years of education: Not on file   Highest education level: Not on file  Occupational History   Not on file  Tobacco Use   Smoking status: Some Days   Smokeless tobacco: Never  Vaping Use   Vaping Use: Never used  Substance and Sexual Activity   Alcohol use: Not Currently   Drug use: No   Sexual activity: Not Currently    Birth control/protection: Inserts  Other Topics Concern   Not on file  Social History Narrative   Right handed    Social Determinants of Health   Financial Resource Strain: Not on file  Food Insecurity: No Food Insecurity (10/28/2017)   Hunger Vital Sign    Worried About Running Out of Food in the Last Year: Never true    Ran Out of  Food in the Last Year: Never true  Transportation Needs: Not on file  Physical Activity: Not on file  Stress: Not on file  Social Connections: Not on file  Intimate Partner Violence: Not on file     PHYSICAL EXAM: Vitals:   09/25/22 0855  BP: (!) 162/82  Pulse: 77  SpO2: 99%   General: No acute distress Head:  Normocephalic/atraumatic Skin/Extremities: No rash, no edema Neurological Exam: Mental status: alert and oriented to person, place, and time, no dysarthria or aphasia, Fund of knowledge is appropriate.  Recent and remote memory are intact, 2/3 delayed recall.  Attention and concentration are normal, 5/5 WORLD backwards.  Cranial nerves: CN I: not tested CN II: pupils equal, round and reactive to light, visual fields intact CN III, IV,  VI:  full range of motion, no nystagmus, no ptosis CN V: facial sensation intact CN VII: upper and lower face symmetric CN VIII: hearing intact to conversation Bulk & Tone: normal, no fasciculations. Motor: 5/5 throughout with no pronator drift. Sensation: intact to light touch, cold, pin, vibration sense.  No extinction to double simultaneous stimulation.  Romberg test negative Deep Tendon Reflexes: +2 on both UE, +1 both LE Cerebellar: no incoordination on finger to nose testing Gait: she felt lightheaded on standing, gait narrow-based and steady, mild difficulty with tandem walk but able Tremor: none   IMPRESSION: This is a 51 year old right-handed woman with no significant past medical history presenting for evaluation of headaches, dizziness, nausea, motion/light sensitivity, daytime drowsiness, cognitive changes, and body twitching, that started after a minor head injury on 07/21/2022. Constellation of most of the symptoms consistent with postconcussion syndrome. The twitching is of unclear etiology. Her neurological exam is normal. With continued symptoms, recommend MRI brain without contrast and EEG. We discussed symptomatic treatment  of postconcussion syndrome. I discussed starting amitriptyline '10mg'$  qhs x 1 week, then increase to '20mg'$  qhs. Side effects discussed. She would also benefit from physical/vestibular therapy for dizziness and gaze stabilization exercises. Follow-up in 4 months, call for any changes.   Thank you for allowing me to participate in the care of this patient. Please do not hesitate to call for any questions or concerns.   Ellouise Newer, M.D.  CC: Dr. Tamera Punt

## 2022-09-25 NOTE — Patient Instructions (Addendum)
Good to meet you.  Start amitriptyline '10mg'$ : take 1 tablet every night for 1 week, then increase to 2 tablets every night  2. Would schedule MRI brain without contrast and EEG, as well as Vestibular therapy for the dizziness  3. Follow-up in 4 months, call for any changes

## 2022-10-15 ENCOUNTER — Other Ambulatory Visit (HOSPITAL_COMMUNITY): Payer: Self-pay

## 2022-10-27 ENCOUNTER — Other Ambulatory Visit (HOSPITAL_COMMUNITY): Payer: Self-pay

## 2022-10-27 MED ORDER — LIOTHYRONINE SODIUM 5 MCG PO TABS
10.0000 ug | ORAL_TABLET | Freq: Every day | ORAL | 1 refills | Status: AC
  Filled 2022-10-27: qty 60, 30d supply, fill #0
  Filled 2022-12-07: qty 60, 30d supply, fill #1

## 2022-11-09 ENCOUNTER — Other Ambulatory Visit (HOSPITAL_COMMUNITY): Payer: Self-pay

## 2022-12-08 ENCOUNTER — Other Ambulatory Visit (HOSPITAL_COMMUNITY): Payer: Self-pay

## 2022-12-08 ENCOUNTER — Other Ambulatory Visit: Payer: Self-pay

## 2023-01-06 ENCOUNTER — Other Ambulatory Visit (HOSPITAL_COMMUNITY): Payer: Self-pay

## 2023-01-06 ENCOUNTER — Encounter: Payer: Self-pay | Admitting: Hematology and Oncology

## 2023-01-28 ENCOUNTER — Encounter: Payer: Self-pay | Admitting: Neurology

## 2023-01-28 ENCOUNTER — Ambulatory Visit (INDEPENDENT_AMBULATORY_CARE_PROVIDER_SITE_OTHER): Payer: Self-pay | Admitting: Neurology

## 2023-01-28 ENCOUNTER — Other Ambulatory Visit (HOSPITAL_COMMUNITY): Payer: Self-pay

## 2023-01-28 VITALS — BP 170/86 | HR 70 | Ht 65.5 in | Wt 295.0 lb

## 2023-01-28 DIAGNOSIS — R253 Fasciculation: Secondary | ICD-10-CM

## 2023-01-28 DIAGNOSIS — F0781 Postconcussional syndrome: Secondary | ICD-10-CM

## 2023-01-28 MED ORDER — AMITRIPTYLINE HCL 10 MG PO TABS
ORAL_TABLET | ORAL | 11 refills | Status: AC
  Filled 2023-01-28 – 2023-05-27 (×2): qty 60, 30d supply, fill #0
  Filled 2023-06-28: qty 60, 30d supply, fill #1

## 2023-01-28 NOTE — Progress Notes (Signed)
NEUROLOGY FOLLOW UP OFFICE NOTE  DEVYANI SAELEE XM:[id number] 10-22-1971  HISTORY OF PRESENT ILLNESS: I had the pleasure of seeing Kimberly Coleman in follow-up in the neurology clinic on 01/28/2023.  The patient was last seen 4 months ago. She had a concussion on 07/21/2022, following that she started having headaches, as well as body twitching. Records and images were personally reviewed where available. We had discussed doing a brain MRI and EEG but tests have not been completed due to insurance issues. She was started on amitriptyline for postconcussion headaches and referred for Vestibular therapy. She notes this has helped, she was not having any headaches until she missed a dose last night and had a headache today. She still has the body jerking but it is not as often. The last 2 days she has noticed it more, last night she was laying still and it went on for a few minutes with a quick jerk in her left arm/leg. She has some neck tension. No focal numbness/tingling/weakness. She has notices she may lean one way or the other when walking, no falls. She is concerned about weight gain from amitriptyline.    History on Initial Assessment 09/25/2022: This is a 52 year old right-handed woman with no significant past medical history, in her usual state of health until 07/21/2022. She works at [employer] and while doing [employer], she knocked heads with another co-worker, hitting the right side of her head. Her "bell was rung," everything went dark purple and she had a bad headache for a couple of days. The headache however persisted, localized over the right parietal region, feeling like an outline like something is hot and radiating from the middle of it. She reports that 2 weeks went by with continued headaches, then "things got weird," headaches persisted and she just felt off center. She went to Urgent Care and was told she had a concussion. She started having nausea, lightheadedness when  standing, a hard time tracking. She would be talking and lose her tracking. The biggest symptoms bothering her were the headaches, nausea, and dizziness, when she gets in a car, she would get very nauseated and have to close her eyes and recline her seat. She would usually drive twice a month to [place] for work but has been unable to do this. Currently she only drives between home and work. She used to play VR doing fitness activities but had to stop because she got very dizzy. She has also had new symptoms of twitching that started on the left side of her body, either her left arm or leg would twitch, then she also noticed it on the right side. She can almost tell before it happens, like a "pulse in my brain," then there is a quick movement and her whole shoulder would twitch. They mostly occur when she is relaxing, she has not noticed the twitching when standing. She has been working the same job for 8 years where she would work day shift and night shift without issues, however since the head injury she would sleep randomly, falling asleep at her desk at work even on day shift. 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 She usually gets 5 hours of sleep. She denies any prior history of headaches. The headaches have subsided a lot, they are not as intense but still occurring almost daily over the right parietal region. She does not take any pain medication. She denies any diplopia, dysarthria/dysphagia. She denies any falls  but is "naturally clumsy." When she first had symptoms, she was listing to the right and holding on to things, but this is better. When asked about memory, she states "it depends," but has noticed she is having more issues remembering things. She denies getting lost driving but uses her GPS more. She has had minor head injuries when younger, no neurosurgical procedures. She was recently started on Cytomel after having abnormal thyroid testing, she thinks this  is causing ankle swelling. No change in symptoms since starting thyroid medication. When asked about anxiety, she states "I've been anxious my whole life." She lives alone. She will be going on a 7-day cruise next week.  I personally reviewed head CT without contrast done 08/14/22 which did not show any acute changes, there was a subcentimeter left dural calcification at the falx.   PAST MEDICAL HISTORY: Past Medical History:  Diagnosis Date   Anemia    Fibroid 1997    MEDICATIONS: Current Outpatient Medications on File Prior to Visit  Medication Sig Dispense Refill   amitriptyline (ELAVIL) 10 MG tablet Take 1 tablet (10 mg total) by mouth Nightly for 7 days, THEN 2 tablets (20 mg total) Nightly. 60 tablet 6   b complex vitamins capsule Take 1 capsule by mouth daily.       EPINEPHrine 0.3 mg/0.3 mL IJ SOAJ injection USE AS DIRECTED AS NEEDED FOR ALLERGIC REACTION INJECTION     ferrous sulfate 325 (65 FE) MG EC tablet Take 1 tablet (325 mg total) by mouth 2 (two) times daily with a meal.  0   liothyronine (CYTOMEL) 5 MCG tablet Take 1 tablet (5 mcg total) by mouth daily on an empty stomach for 7 days, THEN 2 tablets (10 mcg total) daily. 60 tablet 1   liothyronine (CYTOMEL) 5 MCG tablet Take 2 tablets (10 mcg total) by mouth daily. 60 tablet 1   Omega-3 Fatty Acids (FISH OIL) 1000 MG CAPS Take by mouth.     triamcinolone cream (KENALOG) 0.1 % Apply topically 2 (two) times daily for 14 days 30 g 0   No current facility-administered medications on file prior to visit.    ALLERGIES: Allergies  Allergen Reactions   Other Anaphylaxis, Shortness Of Breath and Itching    CASHEWS   Percocet [Oxycodone-Acetaminophen] Nausea And Vomiting    FAMILY HISTORY: Family History  Problem Relation Age of Onset   Diabetes Mother    Hypertension Father    Cancer Other     SOCIAL HISTORY: Social History   Socioeconomic History   Marital status: Single    Spouse name: Not on file   Number of  children: Not on file   Years of education: Not on file   Highest education level: Not on file  Occupational History   Not on file  Tobacco Use   Smoking status: Some Days   Smokeless tobacco: Never  Vaping Use   Vaping Use: Never used  Substance and Sexual Activity   Alcohol use: Yes   Drug use: No   Sexual activity: Not Currently    Birth control/protection: Inserts  Other Topics Concern   Not on file  Social History Narrative   Right handed    Social Determinants of Health   Financial Resource Strain: Not on file  Food Insecurity: No Food Insecurity (10/28/2017)   Hunger Vital Sign    Worried About Running Out of Food in the Last Year: Never true    Ran Out of Food in the Last Year:  Never true  Transportation Needs: Not on file  Physical Activity: Not on file  Stress: Not on file  Social Connections: Not on file  Intimate Partner Violence: Not on file     PHYSICAL EXAM: Vitals:   01/28/23 1121  BP: (!) 170/86  Pulse: 70  SpO2: 98%   General: No acute distress Head:  Normocephalic/atraumatic Skin/Extremities: No rash, no edema Neurological Exam: alert and awake. No aphasia or dysarthria. Fund of knowledge is appropriate. Attention and concentration are normal.   Cranial nerves: Pupils equal, round. Extraocular movements intact with no nystagmus. Visual fields full.  No facial asymmetry.  Motor: Bulk and tone normal, muscle strength 5/5 throughout with no pronator drift.   Finger to nose testing intact.  Gait narrow-based and steady, able to tandem walk adequately.  Romberg negative.No jerking in office today.   IMPRESSION: This is a 52 yo RH woman with no significant past medical history presenting for evaluation of headaches, dizziness, nausea, motion/light sensitivity, daytime drowsiness, cognitive changes, and body twitching, that started after a minor head injury on 07/21/2022. Constellation of most of the symptoms consistent with postconcussion syndrome. Headaches  have improved with amitriptyline. She wonders about weight gain from medication, she can try to reduce to 40m qhs and monitor headaches. The twitching is still of unclear etiology, recommend EEG. She was advised to try to video these episodes for review. Follow-up in 6 months, call for any changes.    Thank you for allowing me to participate in her care.  Please do not hesitate to call for any questions or concerns.    KEllouise Newer M.D.

## 2023-01-28 NOTE — Patient Instructions (Signed)
Good to see you.  Recommend having the EEG done.   2. Refills sent for amitriptyline '10mg'$ : take 2 tablets every night. If you are not having much headaches, you can try reducing to 1 tablet every night and monitor symptoms and if there is change in weight  3. If able, try to get a video of the twitching/jerking movements  4. Follow-up in 6 months, call for any changes

## 2023-02-09 ENCOUNTER — Other Ambulatory Visit (HOSPITAL_COMMUNITY): Payer: Self-pay

## 2023-02-09 ENCOUNTER — Encounter: Payer: Self-pay | Admitting: Hematology and Oncology

## 2023-02-17 ENCOUNTER — Other Ambulatory Visit (HOSPITAL_COMMUNITY): Payer: Self-pay

## 2023-03-10 ENCOUNTER — Other Ambulatory Visit (HOSPITAL_BASED_OUTPATIENT_CLINIC_OR_DEPARTMENT_OTHER): Payer: Self-pay

## 2023-03-10 MED ORDER — HYDROCHLOROTHIAZIDE 25 MG PO TABS
25.0000 mg | ORAL_TABLET | Freq: Every day | ORAL | 1 refills | Status: DC
  Filled 2023-03-10: qty 90, 90d supply, fill #0
  Filled 2023-05-27: qty 90, 90d supply, fill #1

## 2023-03-11 ENCOUNTER — Other Ambulatory Visit (HOSPITAL_BASED_OUTPATIENT_CLINIC_OR_DEPARTMENT_OTHER): Payer: Self-pay

## 2023-03-11 MED ORDER — FERROUS SULFATE 325 (65 FE) MG PO TBEC
325.0000 mg | DELAYED_RELEASE_TABLET | Freq: Every day | ORAL | 1 refills | Status: AC
  Filled 2023-03-11: qty 100, 100d supply, fill #0

## 2023-03-11 MED ORDER — METFORMIN HCL ER 500 MG PO TB24
500.0000 mg | ORAL_TABLET | Freq: Every day | ORAL | 1 refills | Status: DC
  Filled 2023-03-11 – 2023-03-12 (×2): qty 90, 90d supply, fill #0

## 2023-03-12 ENCOUNTER — Other Ambulatory Visit (HOSPITAL_BASED_OUTPATIENT_CLINIC_OR_DEPARTMENT_OTHER): Payer: Self-pay

## 2023-03-12 ENCOUNTER — Other Ambulatory Visit: Payer: Self-pay

## 2023-03-17 ENCOUNTER — Other Ambulatory Visit (HOSPITAL_BASED_OUTPATIENT_CLINIC_OR_DEPARTMENT_OTHER): Payer: Self-pay

## 2023-04-16 ENCOUNTER — Encounter (HOSPITAL_BASED_OUTPATIENT_CLINIC_OR_DEPARTMENT_OTHER): Payer: Self-pay

## 2023-04-16 ENCOUNTER — Other Ambulatory Visit (HOSPITAL_BASED_OUTPATIENT_CLINIC_OR_DEPARTMENT_OTHER): Payer: Self-pay

## 2023-04-16 MED ORDER — ZEPBOUND 2.5 MG/0.5ML ~~LOC~~ SOAJ
2.5000 mg | SUBCUTANEOUS | 0 refills | Status: DC
  Filled 2023-04-16 (×2): qty 2, 28d supply, fill #0

## 2023-04-22 ENCOUNTER — Encounter: Payer: Self-pay | Admitting: Hematology and Oncology

## 2023-04-22 ENCOUNTER — Other Ambulatory Visit (HOSPITAL_BASED_OUTPATIENT_CLINIC_OR_DEPARTMENT_OTHER): Payer: Self-pay

## 2023-05-18 ENCOUNTER — Other Ambulatory Visit (HOSPITAL_BASED_OUTPATIENT_CLINIC_OR_DEPARTMENT_OTHER): Payer: Self-pay

## 2023-05-18 MED ORDER — ZEPBOUND 2.5 MG/0.5ML ~~LOC~~ SOAJ
SUBCUTANEOUS | 0 refills | Status: DC
  Filled 2023-05-18: qty 2, 28d supply, fill #0

## 2023-05-27 ENCOUNTER — Other Ambulatory Visit (HOSPITAL_BASED_OUTPATIENT_CLINIC_OR_DEPARTMENT_OTHER): Payer: Self-pay

## 2023-06-01 ENCOUNTER — Other Ambulatory Visit (HOSPITAL_BASED_OUTPATIENT_CLINIC_OR_DEPARTMENT_OTHER): Payer: Self-pay

## 2023-06-01 MED ORDER — METRONIDAZOLE 500 MG PO TABS
500.0000 mg | ORAL_TABLET | Freq: Two times a day (BID) | ORAL | 0 refills | Status: DC
  Filled 2023-06-01: qty 14, 7d supply, fill #0

## 2023-06-23 ENCOUNTER — Other Ambulatory Visit (HOSPITAL_BASED_OUTPATIENT_CLINIC_OR_DEPARTMENT_OTHER): Payer: Self-pay

## 2023-06-23 MED ORDER — ZEPBOUND 5 MG/0.5ML ~~LOC~~ SOAJ
5.0000 mg | SUBCUTANEOUS | 2 refills | Status: DC
  Filled 2023-06-23: qty 2, 28d supply, fill #0
  Filled 2023-07-30 (×2): qty 2, 28d supply, fill #1
  Filled 2023-09-06: qty 2, 28d supply, fill #2

## 2023-06-28 ENCOUNTER — Other Ambulatory Visit (HOSPITAL_BASED_OUTPATIENT_CLINIC_OR_DEPARTMENT_OTHER): Payer: Self-pay

## 2023-07-15 ENCOUNTER — Other Ambulatory Visit (HOSPITAL_BASED_OUTPATIENT_CLINIC_OR_DEPARTMENT_OTHER): Payer: Self-pay

## 2023-07-15 MED ORDER — MEDROXYPROGESTERONE ACETATE 10 MG PO TABS
10.0000 mg | ORAL_TABLET | Freq: Every day | ORAL | 1 refills | Status: AC
  Filled 2023-07-15: qty 30, 30d supply, fill #0
  Filled 2023-08-19 – 2023-09-06 (×2): qty 30, 30d supply, fill #1

## 2023-07-30 ENCOUNTER — Other Ambulatory Visit (HOSPITAL_BASED_OUTPATIENT_CLINIC_OR_DEPARTMENT_OTHER): Payer: Self-pay

## 2023-08-20 ENCOUNTER — Other Ambulatory Visit (HOSPITAL_BASED_OUTPATIENT_CLINIC_OR_DEPARTMENT_OTHER): Payer: Self-pay

## 2023-08-20 MED ORDER — MEDROXYPROGESTERONE ACETATE 10 MG PO TABS
ORAL_TABLET | ORAL | 0 refills | Status: AC
  Filled 2023-08-20: qty 90, 78d supply, fill #0

## 2023-08-26 ENCOUNTER — Ambulatory Visit: Payer: Self-pay | Admitting: Neurology

## 2023-09-03 ENCOUNTER — Other Ambulatory Visit (HOSPITAL_BASED_OUTPATIENT_CLINIC_OR_DEPARTMENT_OTHER): Payer: Self-pay

## 2023-09-03 MED ORDER — MEDROXYPROGESTERONE ACETATE 10 MG PO TABS
ORAL_TABLET | ORAL | 3 refills | Status: AC
  Filled 2023-09-03 – 2023-10-07 (×3): qty 90, 45d supply, fill #0
  Filled 2023-11-22: qty 90, 45d supply, fill #1
  Filled 2024-01-18: qty 90, 45d supply, fill #2
  Filled 2024-03-06: qty 90, 45d supply, fill #3

## 2023-09-06 ENCOUNTER — Other Ambulatory Visit (HOSPITAL_BASED_OUTPATIENT_CLINIC_OR_DEPARTMENT_OTHER): Payer: Self-pay

## 2023-09-06 MED ORDER — HYDROCHLOROTHIAZIDE 25 MG PO TABS
25.0000 mg | ORAL_TABLET | Freq: Every day | ORAL | 0 refills | Status: DC
  Filled 2023-09-06: qty 90, 90d supply, fill #0

## 2023-09-07 ENCOUNTER — Other Ambulatory Visit (HOSPITAL_BASED_OUTPATIENT_CLINIC_OR_DEPARTMENT_OTHER): Payer: Self-pay

## 2023-09-30 ENCOUNTER — Other Ambulatory Visit (HOSPITAL_BASED_OUTPATIENT_CLINIC_OR_DEPARTMENT_OTHER): Payer: Self-pay

## 2023-09-30 MED ORDER — SULFAMETHOXAZOLE-TRIMETHOPRIM 800-160 MG PO TABS
1.0000 | ORAL_TABLET | Freq: Two times a day (BID) | ORAL | 0 refills | Status: AC
  Filled 2023-09-30: qty 10, 5d supply, fill #0

## 2023-10-07 ENCOUNTER — Other Ambulatory Visit (HOSPITAL_BASED_OUTPATIENT_CLINIC_OR_DEPARTMENT_OTHER): Payer: Self-pay

## 2023-10-07 ENCOUNTER — Encounter: Payer: Self-pay | Admitting: Hematology and Oncology

## 2023-10-18 ENCOUNTER — Other Ambulatory Visit (HOSPITAL_BASED_OUTPATIENT_CLINIC_OR_DEPARTMENT_OTHER): Payer: Self-pay

## 2023-10-18 ENCOUNTER — Encounter: Payer: Self-pay | Admitting: Hematology and Oncology

## 2023-10-18 MED ORDER — IBUPROFEN 600 MG PO TABS
600.0000 mg | ORAL_TABLET | Freq: Four times a day (QID) | ORAL | 0 refills | Status: AC
  Filled 2023-10-18: qty 40, 10d supply, fill #0

## 2023-10-18 MED ORDER — ACETAMINOPHEN 325 MG PO TABS
650.0000 mg | ORAL_TABLET | Freq: Four times a day (QID) | ORAL | 0 refills | Status: AC | PRN
  Filled 2023-10-18: qty 30, 4d supply, fill #0

## 2023-10-18 MED ORDER — ONDANSETRON HCL 4 MG PO TABS
4.0000 mg | ORAL_TABLET | Freq: Three times a day (TID) | ORAL | 0 refills | Status: AC | PRN
  Filled 2023-10-18: qty 2, 1d supply, fill #0

## 2023-10-18 MED ORDER — SENNOSIDES-DOCUSATE SODIUM 8.6-50 MG PO TABS
1.0000 | ORAL_TABLET | Freq: Two times a day (BID) | ORAL | 0 refills | Status: AC
  Filled 2023-10-18: qty 100, 15d supply, fill #0

## 2023-10-19 ENCOUNTER — Other Ambulatory Visit (HOSPITAL_BASED_OUTPATIENT_CLINIC_OR_DEPARTMENT_OTHER): Payer: Self-pay

## 2023-11-22 ENCOUNTER — Other Ambulatory Visit (HOSPITAL_BASED_OUTPATIENT_CLINIC_OR_DEPARTMENT_OTHER): Payer: Self-pay

## 2023-11-22 ENCOUNTER — Encounter: Payer: Self-pay | Admitting: Hematology and Oncology

## 2023-11-22 MED ORDER — ACETAMINOPHEN 325 MG PO TABS
650.0000 mg | ORAL_TABLET | Freq: Four times a day (QID) | ORAL | 0 refills | Status: AC | PRN
  Filled 2023-11-22: qty 30, 4d supply, fill #0

## 2023-11-22 MED ORDER — IBUPROFEN 600 MG PO TABS
600.0000 mg | ORAL_TABLET | Freq: Four times a day (QID) | ORAL | 0 refills | Status: AC | PRN
  Filled 2023-11-22: qty 30, 8d supply, fill #0

## 2023-12-02 ENCOUNTER — Other Ambulatory Visit: Payer: Self-pay

## 2023-12-02 ENCOUNTER — Other Ambulatory Visit (HOSPITAL_BASED_OUTPATIENT_CLINIC_OR_DEPARTMENT_OTHER): Payer: Self-pay

## 2023-12-02 MED ORDER — HYDROCHLOROTHIAZIDE 25 MG PO TABS
25.0000 mg | ORAL_TABLET | Freq: Every day | ORAL | 0 refills | Status: DC
  Filled 2023-12-02: qty 90, 90d supply, fill #0

## 2023-12-02 MED ORDER — ZEPBOUND 5 MG/0.5ML ~~LOC~~ SOAJ
5.0000 mg | SUBCUTANEOUS | 2 refills | Status: DC
  Filled 2023-12-02: qty 2, 28d supply, fill #0
  Filled 2023-12-29: qty 2, 28d supply, fill #1
  Filled 2024-02-17: qty 2, 28d supply, fill #2

## 2023-12-03 ENCOUNTER — Other Ambulatory Visit (HOSPITAL_BASED_OUTPATIENT_CLINIC_OR_DEPARTMENT_OTHER): Payer: Self-pay

## 2023-12-29 ENCOUNTER — Other Ambulatory Visit (HOSPITAL_BASED_OUTPATIENT_CLINIC_OR_DEPARTMENT_OTHER): Payer: Self-pay

## 2024-01-18 ENCOUNTER — Other Ambulatory Visit: Payer: Self-pay

## 2024-02-17 ENCOUNTER — Other Ambulatory Visit (HOSPITAL_BASED_OUTPATIENT_CLINIC_OR_DEPARTMENT_OTHER): Payer: Self-pay

## 2024-02-17 MED ORDER — COVID-19 MRNA VAC-TRIS(PFIZER) 30 MCG/0.3ML IM SUSY
0.3000 mL | PREFILLED_SYRINGE | Freq: Once | INTRAMUSCULAR | 0 refills | Status: AC
  Filled 2024-02-17: qty 0.3, 1d supply, fill #0

## 2024-02-17 MED ORDER — MEDROXYPROGESTERONE ACETATE 10 MG PO TABS
20.0000 mg | ORAL_TABLET | Freq: Every day | ORAL | 3 refills | Status: AC
  Filled 2024-02-17 – 2024-02-21 (×2): qty 180, 90d supply, fill #0
  Filled 2024-06-15: qty 60, 30d supply, fill #0
  Filled 2024-06-15: qty 120, 60d supply, fill #0

## 2024-02-21 ENCOUNTER — Other Ambulatory Visit (HOSPITAL_BASED_OUTPATIENT_CLINIC_OR_DEPARTMENT_OTHER): Payer: Self-pay

## 2024-03-06 ENCOUNTER — Other Ambulatory Visit (HOSPITAL_BASED_OUTPATIENT_CLINIC_OR_DEPARTMENT_OTHER): Payer: Self-pay

## 2024-03-07 ENCOUNTER — Other Ambulatory Visit (HOSPITAL_BASED_OUTPATIENT_CLINIC_OR_DEPARTMENT_OTHER): Payer: Self-pay

## 2024-03-07 MED ORDER — HYDROCHLOROTHIAZIDE 25 MG PO TABS
25.0000 mg | ORAL_TABLET | Freq: Every day | ORAL | 0 refills | Status: DC
  Filled 2024-03-07: qty 90, 90d supply, fill #0

## 2024-04-10 ENCOUNTER — Other Ambulatory Visit (HOSPITAL_BASED_OUTPATIENT_CLINIC_OR_DEPARTMENT_OTHER): Payer: Self-pay

## 2024-04-10 MED ORDER — EPINEPHRINE 0.3 MG/0.3ML IJ SOAJ
INTRAMUSCULAR | 0 refills | Status: AC
Start: 2024-04-10 — End: ?
  Filled 2024-04-10: qty 2, 1d supply, fill #0

## 2024-04-11 ENCOUNTER — Other Ambulatory Visit (HOSPITAL_BASED_OUTPATIENT_CLINIC_OR_DEPARTMENT_OTHER): Payer: Self-pay

## 2024-04-11 MED ORDER — ZEPBOUND 7.5 MG/0.5ML ~~LOC~~ SOAJ
7.5000 mg | SUBCUTANEOUS | 0 refills | Status: DC
  Filled 2024-04-11: qty 2, 28d supply, fill #0

## 2024-04-12 ENCOUNTER — Other Ambulatory Visit (HOSPITAL_BASED_OUTPATIENT_CLINIC_OR_DEPARTMENT_OTHER): Payer: Self-pay

## 2024-04-18 ENCOUNTER — Encounter: Payer: Self-pay | Admitting: Hematology and Oncology

## 2024-04-18 ENCOUNTER — Other Ambulatory Visit (HOSPITAL_BASED_OUTPATIENT_CLINIC_OR_DEPARTMENT_OTHER): Payer: Self-pay

## 2024-04-18 MED ORDER — AMPHETAMINE-DEXTROAMPHET ER 20 MG PO CP24
20.0000 mg | ORAL_CAPSULE | Freq: Every morning | ORAL | 0 refills | Status: DC
Start: 2024-04-17 — End: 2024-05-05
  Filled 2024-04-18: qty 14, 14d supply, fill #0

## 2024-05-05 ENCOUNTER — Other Ambulatory Visit (HOSPITAL_BASED_OUTPATIENT_CLINIC_OR_DEPARTMENT_OTHER): Payer: Self-pay

## 2024-05-05 MED ORDER — AMPHETAMINE-DEXTROAMPHET ER 20 MG PO CP24
20.0000 mg | ORAL_CAPSULE | Freq: Every morning | ORAL | 0 refills | Status: DC
  Filled 2024-05-05: qty 30, 30d supply, fill #0

## 2024-06-15 ENCOUNTER — Other Ambulatory Visit (HOSPITAL_BASED_OUTPATIENT_CLINIC_OR_DEPARTMENT_OTHER): Payer: Self-pay

## 2024-06-15 ENCOUNTER — Other Ambulatory Visit: Payer: Self-pay

## 2024-06-15 ENCOUNTER — Encounter: Payer: Self-pay | Admitting: Hematology and Oncology

## 2024-06-15 MED ORDER — ZEPBOUND 7.5 MG/0.5ML ~~LOC~~ SOAJ
7.5000 mg | SUBCUTANEOUS | 0 refills | Status: DC
  Filled 2024-06-15: qty 2, 28d supply, fill #0

## 2024-06-15 MED ORDER — HYDROCHLOROTHIAZIDE 25 MG PO TABS
25.0000 mg | ORAL_TABLET | Freq: Every day | ORAL | 0 refills | Status: DC
  Filled 2024-06-15: qty 90, 90d supply, fill #0

## 2024-06-26 ENCOUNTER — Other Ambulatory Visit (HOSPITAL_BASED_OUTPATIENT_CLINIC_OR_DEPARTMENT_OTHER): Payer: Self-pay

## 2024-06-26 MED ORDER — AMPHETAMINE-DEXTROAMPHET ER 20 MG PO CP24
20.0000 mg | ORAL_CAPSULE | Freq: Every morning | ORAL | 0 refills | Status: AC
  Filled 2024-06-26: qty 30, 30d supply, fill #0

## 2024-08-08 ENCOUNTER — Other Ambulatory Visit (HOSPITAL_BASED_OUTPATIENT_CLINIC_OR_DEPARTMENT_OTHER): Payer: Self-pay

## 2024-08-09 ENCOUNTER — Other Ambulatory Visit (HOSPITAL_BASED_OUTPATIENT_CLINIC_OR_DEPARTMENT_OTHER): Payer: Self-pay

## 2024-08-09 ENCOUNTER — Encounter (HOSPITAL_BASED_OUTPATIENT_CLINIC_OR_DEPARTMENT_OTHER): Payer: Self-pay

## 2024-08-09 MED ORDER — ZEPBOUND 7.5 MG/0.5ML ~~LOC~~ SOAJ
7.5000 mg | SUBCUTANEOUS | 0 refills | Status: AC
  Filled 2024-08-09: qty 2, 28d supply, fill #0

## 2024-09-14 ENCOUNTER — Other Ambulatory Visit (HOSPITAL_BASED_OUTPATIENT_CLINIC_OR_DEPARTMENT_OTHER): Payer: Self-pay

## 2024-09-14 MED ORDER — HYDROCHLOROTHIAZIDE 25 MG PO TABS
25.0000 mg | ORAL_TABLET | Freq: Every day | ORAL | 3 refills | Status: AC
  Filled 2024-09-14: qty 90, 90d supply, fill #0
  Filled 2024-12-13: qty 90, 90d supply, fill #1

## 2024-11-01 ENCOUNTER — Other Ambulatory Visit (HOSPITAL_BASED_OUTPATIENT_CLINIC_OR_DEPARTMENT_OTHER): Payer: Self-pay

## 2024-11-01 MED ORDER — AMPHETAMINE-DEXTROAMPHETAMINE 20 MG PO TABS
20.0000 mg | ORAL_TABLET | Freq: Every day | ORAL | 0 refills | Status: DC
  Filled 2024-11-01: qty 30, 30d supply, fill #0

## 2024-11-01 MED ORDER — AMPHETAMINE-DEXTROAMPHET ER 20 MG PO CP24
20.0000 mg | ORAL_CAPSULE | Freq: Every morning | ORAL | 0 refills | Status: DC
  Filled 2024-11-01: qty 30, 30d supply, fill #0

## 2024-12-02 ENCOUNTER — Other Ambulatory Visit (HOSPITAL_BASED_OUTPATIENT_CLINIC_OR_DEPARTMENT_OTHER): Payer: Self-pay

## 2024-12-02 MED ORDER — AMPHETAMINE-DEXTROAMPHETAMINE 20 MG PO TABS
20.0000 mg | ORAL_TABLET | Freq: Every day | ORAL | 0 refills | Status: DC
  Filled 2024-12-02: qty 30, 30d supply, fill #0

## 2024-12-02 MED ORDER — AMPHETAMINE-DEXTROAMPHET ER 20 MG PO CP24
20.0000 mg | ORAL_CAPSULE | Freq: Every morning | ORAL | 0 refills | Status: DC
  Filled 2024-12-02: qty 30, 30d supply, fill #0

## 2025-01-05 ENCOUNTER — Other Ambulatory Visit (HOSPITAL_BASED_OUTPATIENT_CLINIC_OR_DEPARTMENT_OTHER): Payer: Self-pay

## 2025-01-05 MED ORDER — AMPHETAMINE-DEXTROAMPHET ER 20 MG PO CP24
20.0000 mg | ORAL_CAPSULE | Freq: Every morning | ORAL | 0 refills | Status: AC
  Filled 2025-01-05: qty 30, 30d supply, fill #0

## 2025-01-05 MED ORDER — AMPHETAMINE-DEXTROAMPHETAMINE 20 MG PO TABS
20.0000 mg | ORAL_TABLET | Freq: Every day | ORAL | 0 refills | Status: AC
  Filled 2025-01-05: qty 30, 30d supply, fill #0
